# Patient Record
Sex: Female | Born: 2000 | Race: White | Hispanic: No | Marital: Single | Smoking: Former smoker
Health system: Southern US, Community
[De-identification: ages and names within clinical notes are randomized; demographics above are authoritative.]

## PROBLEM LIST (undated history)

## (undated) DIAGNOSIS — F32A Depression, unspecified: Secondary | ICD-10-CM

## (undated) DIAGNOSIS — F419 Anxiety disorder, unspecified: Secondary | ICD-10-CM

## (undated) HISTORY — DX: Anxiety disorder, unspecified: F41.9

## (undated) HISTORY — PX: TONSILLECTOMY AND ADENOIDECTOMY: SHX28

## (undated) HISTORY — DX: Depression, unspecified: F32.A

---
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
## 2020-01-04 ENCOUNTER — Ambulatory Visit (INDEPENDENT_AMBULATORY_CARE_PROVIDER_SITE_OTHER): Payer: BC Managed Care – PPO | Admitting: Hospice and Palliative Medicine

## 2020-01-04 ENCOUNTER — Encounter: Payer: Self-pay | Admitting: Hospice and Palliative Medicine

## 2020-01-04 ENCOUNTER — Encounter (INDEPENDENT_AMBULATORY_CARE_PROVIDER_SITE_OTHER): Payer: Self-pay

## 2020-01-04 ENCOUNTER — Other Ambulatory Visit: Payer: Self-pay

## 2020-01-04 DIAGNOSIS — E559 Vitamin D deficiency, unspecified: Secondary | ICD-10-CM

## 2020-01-04 DIAGNOSIS — F339 Major depressive disorder, recurrent, unspecified: Secondary | ICD-10-CM | POA: Diagnosis not present

## 2020-01-04 DIAGNOSIS — E538 Deficiency of other specified B group vitamins: Secondary | ICD-10-CM

## 2020-01-04 MED ORDER — CYANOCOBALAMIN 1000 MCG/ML IJ SOLN
1000.0000 ug | Freq: Once | INTRAMUSCULAR | Status: AC
  Administered 2020-01-04: 1000 ug via INTRAMUSCULAR

## 2020-01-04 MED ORDER — SERTRALINE HCL 50 MG PO TABS: 50.0000 mg | ORAL_TABLET | Freq: Every day | ORAL | 3 refills | Status: DC

## 2020-01-04 NOTE — Progress Notes (Signed)
[facility] [place], [place] [address]  Internal MEDICINE  Office Visit Note  Patient Name: Jordan Riley  [id number]  [id number]  Date of Service: 01/07/2020  Chief Complaint  Patient presents with  . Follow-up  . Depression  . Quality Metric Gaps    flu, covid, hep C    HPI Patient is here for routine follow-up She was recently started on Lexapro for her depression--she does not feel that her depression is well managed She feels sad most days, she is not sleeping well at night, averages about 3-4 hours of sleep per night She also says she feels that she has some form of eating disorder--maybe a binge eating disorder, she will eat some days until she feels sick and then she will go a day barely eating Since starting Lexapro she has not noticed any improvement in her symptoms or worsening of symptoms  She does not endorse any signs or report SI or HI   Current Medication: Outpatient Encounter Medications as of 01/04/2020  Medication Sig  . escitalopram (LEXAPRO) 10 MG tablet Take 1 tablet (10 mg total) by mouth daily.  . Norethin Ace-Eth Estrad-FE (LOESTRIN FE 1/20 PO) Take 1 tablet by mouth daily.  . sertraline (ZOLOFT) 50 MG tablet Take 1 tablet (50 mg total) by mouth daily.  . [EXPIRED] cyanocobalamin ((VITAMIN B-12)) injection 1,000 mcg    No facility-administered encounter medications on file as of 01/04/2020.    Surgical History: Past Surgical History:  Procedure Laterality Date  . TONSILLECTOMY AND ADENOIDECTOMY      Medical History: Past Medical History:  Diagnosis Date  . Depression     Family History: Family History  Problem Relation Age of Onset  . Heart disease Mother   . Hypothyroidism Mother     Social History   Socioeconomic History  . Marital status: Single    Spouse name: Not on file  . Number of children: Not on file  . Years of education: Not on file  . Highest education level: Not on file  Occupational  History  . Not on file  Tobacco Use  . Smoking status: Former Smoker    Types: E-cigarettes  . Smokeless tobacco: Never Used  Substance and Sexual Activity  . Alcohol use: Never  . Drug use: Never  . Sexual activity: Not on file  Other Topics Concern  . Not on file  Social History Narrative  . Not on file   Social Determinants of Health   Financial Resource Strain:   . Difficulty of Paying Living Expenses: Not on file  Food Insecurity:   . Worried About Charity fundraiser in the Last Year: Not on file  . Ran Out of Food in the Last Year: Not on file  Transportation Needs:   . Lack of Transportation (Medical): Not on file  . Lack of Transportation (Non-Medical): Not on file  Physical Activity:   . Days of Exercise per Week: Not on file  . Minutes of Exercise per Session: Not on file  Stress:   . Feeling of Stress : Not on file  Social Connections:   . Frequency of Communication with Friends and Family: Not on file  . Frequency of Social Gatherings with Friends and Family: Not on file  . Attends Religious Services: Not on file  . Active Member of Clubs or Organizations: Not on file  . Attends Archivist Meetings: Not on file  . Marital Status: Not on file  Intimate  Partner Violence:   . Fear of Current or Ex-Partner: Not on file  . Emotionally Abused: Not on file  . Physically Abused: Not on file  . Sexually Abused: Not on file      Review of Systems  Constitutional: Negative for chills, diaphoresis and fatigue.  HENT: Negative for ear pain, postnasal drip and sinus pressure.   Eyes: Negative for photophobia, discharge, redness, itching and visual disturbance.  Respiratory: Negative for cough, shortness of breath and wheezing.   Cardiovascular: Negative for chest pain, palpitations and leg swelling.  Gastrointestinal: Negative for abdominal pain, constipation, diarrhea, nausea and vomiting.  Genitourinary: Negative for dysuria and flank pain.   Musculoskeletal: Negative for arthralgias, back pain, gait problem and neck pain.  Skin: Negative for color change.  Allergic/Immunologic: Negative for environmental allergies and food allergies.  Neurological: Negative for dizziness and headaches.  Hematological: Does not bruise/bleed easily.  Psychiatric/Behavioral: Negative for agitation, behavioral problems (depression) and hallucinations.       Sad    Vital Signs: BP 115/66   Pulse 84   Temp (!) 97.5 F (36.4 C)   Resp 16   Ht 5\' 6"  (1.676 m)   Wt 198 lb (89.8 kg)   SpO2 99%   BMI 31.96 kg/m    Physical Exam Vitals reviewed.  Constitutional:      Appearance: Normal appearance.  Cardiovascular:     Rate and Rhythm: Normal rate and regular rhythm.     Pulses: Normal pulses.     Heart sounds: Normal heart sounds.  Pulmonary:     Effort: Pulmonary effort is normal.     Breath sounds: Normal breath sounds.  Musculoskeletal:        General: Normal range of motion.  Skin:    General: Skin is warm.  Neurological:     General: No focal deficit present.     Mental Status: She is alert and oriented to person, place, and time. Mental status is at baseline.  Psychiatric:        Mood and Affect: Mood normal.        Behavior: Behavior normal.        Thought Content: Thought content normal.    Assessment/Plan: 1. Vitamin B12 deficiency Injection administered today, will need month injections for replacement - cyanocobalamin ((VITAMIN B-12)) injection 1,000 mcg  2. Vitamin D deficiency Has started taking OTC vitamin D supplements--will continue monitoring  3. Depression, recurrent ([place]) Stop Lexapro and will start Zoloft Refer to psychiatry due to younger age as well as potential symptoms of binge eating Contact information for local therapists and counselors provided today--encouraged to seek counseling to help manage depression - Ambulatory referral to Psychiatry - sertraline (ZOLOFT) 50 MG tablet; Take 1 tablet (50  mg total) by mouth daily.  Dispense: 30 tablet; Refill: 3  General Counseling: Jordan Riley verbalizes understanding of the findings of todays visit and agrees with plan of treatment. I have discussed any further diagnostic evaluation that may be needed or ordered today. We also reviewed her medications today. she has been encouraged to call the office with any questions or concerns that should arise related to todays visit.    Orders Placed This Encounter  Procedures  . Ambulatory referral to Psychiatry    Meds ordered this encounter  Medications  . sertraline (ZOLOFT) 50 MG tablet    Sig: Take 1 tablet (50 mg total) by mouth daily.    Dispense:  30 tablet    Refill:  3  . cyanocobalamin ((  VITAMIN B-12)) injection 1,000 mcg    Time spent:30 Minutes Time spent includes review of chart, medications, test results and follow-up plan with the patient.  This patient was seen by Theodoro Grist AGNP-C in Collaboration with Dr Lavera Guise as a part of collaborative care agreement     Gale Klar. Kinjal Neitzke AGNP-C Internal medicine

## 2020-01-07 ENCOUNTER — Encounter: Payer: Self-pay | Admitting: Hospice and Palliative Medicine

## 2020-02-15 ENCOUNTER — Ambulatory Visit: Payer: BC Managed Care – PPO | Admitting: Hospice and Palliative Medicine

## 2020-02-22 ENCOUNTER — Ambulatory Visit: Payer: BC Managed Care – PPO | Admitting: Hospice and Palliative Medicine

## 2020-02-27 ENCOUNTER — Other Ambulatory Visit: Payer: Self-pay

## 2020-02-27 ENCOUNTER — Encounter: Payer: Self-pay | Admitting: Hospice and Palliative Medicine

## 2020-02-27 ENCOUNTER — Ambulatory Visit: Payer: BC Managed Care – PPO | Admitting: Hospice and Palliative Medicine

## 2020-02-27 VITALS — BP 106/68 | HR 96 | Temp 97.9°F | Resp 16 | Ht 66.0 in | Wt 211.0 lb

## 2020-02-27 DIAGNOSIS — E538 Deficiency of other specified B group vitamins: Secondary | ICD-10-CM

## 2020-02-27 DIAGNOSIS — E559 Vitamin D deficiency, unspecified: Secondary | ICD-10-CM

## 2020-02-27 DIAGNOSIS — R102 Pelvic and perineal pain: Secondary | ICD-10-CM

## 2020-02-27 DIAGNOSIS — R1030 Lower abdominal pain, unspecified: Secondary | ICD-10-CM

## 2020-02-27 DIAGNOSIS — F339 Major depressive disorder, recurrent, unspecified: Secondary | ICD-10-CM

## 2020-02-27 MED ORDER — SERTRALINE HCL 50 MG PO TABS: 50.0000 mg | ORAL_TABLET | Freq: Every day | ORAL | 1 refills | Status: DC

## 2020-02-27 NOTE — Progress Notes (Signed)
[facility] [place], [place] [address]  Internal MEDICINE  Office Visit Note  Patient Name: Jordan Riley  [id number]  [id number]  Date of Service: 02/29/2020  Chief Complaint  Patient presents with  . Follow-up    6 week medications    HPI Patient is here for routine follow-up At our last visit she was started on Zoloft--feels as though Zoloft is much more effective at controlling her anxiety She also reports she is sleeping better since starting Zoloft  Has not experienced negative side effects since starting medicine  Complaining today of lower abdominal pain associated with pelvic pressure, pain has been off and on for several months but over the last 2-3 weeks pain and pressure has gotten more severe, denies diarrhea, constipation, N/V At times pain becomes sharp and intense, at times pain causes nausea Denies vaginal discharge or odor  Has not yet started taking vitamin d supplements--will pick up OTC supplements this week   Current Medication: Outpatient Encounter Medications as of 02/27/2020  Medication Sig  . Norethin Ace-Eth Estrad-FE (LOESTRIN FE 1/20 PO) Take 1 tablet by mouth daily.  . [DISCONTINUED] escitalopram (LEXAPRO) 10 MG tablet Take 1 tablet (10 mg total) by mouth daily.  . [DISCONTINUED] sertraline (ZOLOFT) 50 MG tablet Take 1 tablet (50 mg total) by mouth daily.  . sertraline (ZOLOFT) 50 MG tablet Take 1 tablet (50 mg total) by mouth daily.   No facility-administered encounter medications on file as of 02/27/2020.    Surgical History: Past Surgical History:  Procedure Laterality Date  . TONSILLECTOMY AND ADENOIDECTOMY      Medical History: Past Medical History:  Diagnosis Date  . Depression     Family History: Family History  Problem Relation Age of Onset  . Heart disease Mother   . Hypothyroidism Mother     Social History   Socioeconomic History  . Marital status: Single    Spouse name: Not on file  .  Number of children: Not on file  . Years of education: Not on file  . Highest education level: Not on file  Occupational History  . Not on file  Tobacco Use  . Smoking status: Former Smoker    Types: E-cigarettes  . Smokeless tobacco: Never Used  Substance and Sexual Activity  . Alcohol use: Never  . Drug use: Never  . Sexual activity: Not on file  Other Topics Concern  . Not on file  Social History Narrative  . Not on file   Social Determinants of Health   Financial Resource Strain: Not on file  Food Insecurity: Not on file  Transportation Needs: Not on file  Physical Activity: Not on file  Stress: Not on file  Social Connections: Not on file  Intimate Partner Violence: Not on file    Review of Systems  Constitutional: Negative for chills, diaphoresis and fatigue.  HENT: Negative for ear pain, postnasal drip and sinus pressure.   Eyes: Negative for photophobia, discharge, redness, itching and visual disturbance.  Respiratory: Negative for cough, shortness of breath and wheezing.   Cardiovascular: Negative for chest pain, palpitations and leg swelling.  Gastrointestinal: Positive for abdominal distention and abdominal pain. Negative for constipation, diarrhea, nausea and vomiting.  Genitourinary: Negative for dysuria and flank pain.  Musculoskeletal: Negative for arthralgias, back pain, gait problem and neck pain.  Skin: Negative for color change.  Allergic/Immunologic: Negative for environmental allergies and food allergies.  Neurological: Negative for dizziness and headaches.  Hematological: Does not bruise/bleed easily.  Psychiatric/Behavioral: Negative for agitation, behavioral problems (depression) and hallucinations.    Vital Signs: BP 106/68   Pulse 96   Temp 97.9 F (36.6 C)   Resp 16   Ht 5\' 6"  (1.676 m)   Wt 211 lb (95.7 kg)   SpO2 98%   BMI 34.06 kg/m    Physical Exam Vitals reviewed.  Constitutional:      Appearance: Normal appearance. She is  obese.  Cardiovascular:     Rate and Rhythm: Normal rate and regular rhythm.     Pulses: Normal pulses.     Heart sounds: Normal heart sounds.  Pulmonary:     Effort: Pulmonary effort is normal.     Breath sounds: Normal breath sounds.  Abdominal:     General: There is distension.     Tenderness: There is abdominal tenderness in the right lower quadrant and left lower quadrant.  Musculoskeletal:     Cervical back: Normal range of motion.  Neurological:     Mental Status: She is alert.    Assessment/Plan: 1. Lower abdominal pain Will obtain CT abdomen due to lower quadrant pain and tenderness to palpation - CT Abdomen Pelvis Wo Contrast; Future  2. Pelvic pressure in female Symptom of pelvic pressure concerning--will review CT abdomen results - CT Abdomen Pelvis Wo Contrast; Future  3. Depression, recurrent ([place]) Feels depression controlled with Zoloft, will continue with therapy and continue to closely monitor - sertraline (ZOLOFT) 50 MG tablet; Take 1 tablet (50 mg total) by mouth daily.  Dispense: 90 tablet; Refill: 1  4. Vitamin B 12 deficiency Needs to set up schedule to continue with monthly injections  5. Vitamin D deficiency Advised to start OTC supplements to treat deficiency  General Counseling: Nicholl verbalizes understanding of the findings of todays visit and agrees with plan of treatment. I have discussed any further diagnostic evaluation that may be needed or ordered today. We also reviewed her medications today. she has been encouraged to call the office with any questions or concerns that should arise related to todays visit.    Orders Placed This Encounter  Procedures  . CT Abdomen Pelvis Wo Contrast    Meds ordered this encounter  Medications  . sertraline (ZOLOFT) 50 MG tablet    Sig: Take 1 tablet (50 mg total) by mouth daily.    Dispense:  90 tablet    Refill:  1    Time spent 30 Minutes Time spent includes review of chart, medications, test  results and follow-up plan with the patient.  This patient was seen by Theodoro Grist AGNP-C in Collaboration with Dr Lavera Guise as a part of collaborative care agreement     Ellene Bloodsaw. Milarose Savich AGNP-C Internal medicine

## 2020-02-29 ENCOUNTER — Encounter: Payer: Self-pay | Admitting: Hospice and Palliative Medicine

## 2020-03-20 DIAGNOSIS — D229 Melanocytic nevi, unspecified: Secondary | ICD-10-CM

## 2020-03-20 HISTORY — DX: Melanocytic nevi, unspecified: D22.9

## 2020-03-28 ENCOUNTER — Other Ambulatory Visit: Payer: Self-pay | Admitting: Hospice and Palliative Medicine

## 2020-03-28 ENCOUNTER — Other Ambulatory Visit: Payer: Self-pay

## 2020-03-28 ENCOUNTER — Telehealth: Payer: Self-pay

## 2020-03-28 DIAGNOSIS — R102 Pelvic and perineal pain: Secondary | ICD-10-CM

## 2020-03-28 DIAGNOSIS — R1030 Lower abdominal pain, unspecified: Secondary | ICD-10-CM

## 2020-03-28 NOTE — Progress Notes (Signed)
Changed ct orders to ultrasound per insurance requirement. Jordan Riley

## 2020-03-28 NOTE — Telephone Encounter (Signed)
Left message and advised pt of ultrasound appt and asked her to call back to schd a follow up for result. Jordan Riley

## 2020-04-04 ENCOUNTER — Ambulatory Visit: Admission: RE | Admit: 2020-04-04 | Payer: BC Managed Care – PPO | Source: Ambulatory Visit

## 2020-04-04 ENCOUNTER — Telehealth: Payer: Self-pay

## 2020-04-04 NOTE — Telephone Encounter (Signed)
Per [facility] pt did not show up for ultrasound, she needs to call back and reschedule, this was needed before ct could be approved. [phone]. Jordan Riley

## 2020-06-27 ENCOUNTER — Other Ambulatory Visit: Payer: Self-pay

## 2020-06-27 ENCOUNTER — Ambulatory Visit (INDEPENDENT_AMBULATORY_CARE_PROVIDER_SITE_OTHER): Payer: BC Managed Care – PPO | Admitting: Hospice and Palliative Medicine

## 2020-06-27 ENCOUNTER — Encounter: Payer: Self-pay | Admitting: Hospice and Palliative Medicine

## 2020-06-27 VITALS — BP 104/56 | HR 83 | Temp 97.1°F | Resp 16 | Ht 66.0 in | Wt 215.6 lb

## 2020-06-27 DIAGNOSIS — R102 Pelvic and perineal pain: Secondary | ICD-10-CM | POA: Diagnosis not present

## 2020-06-27 DIAGNOSIS — F418 Other specified anxiety disorders: Secondary | ICD-10-CM

## 2020-06-27 DIAGNOSIS — L732 Hidradenitis suppurativa: Secondary | ICD-10-CM | POA: Diagnosis not present

## 2020-06-27 DIAGNOSIS — R140 Abdominal distension (gaseous): Secondary | ICD-10-CM

## 2020-06-27 DIAGNOSIS — Z0001 Encounter for general adult medical examination with abnormal findings: Secondary | ICD-10-CM | POA: Diagnosis not present

## 2020-06-27 DIAGNOSIS — Z124 Encounter for screening for malignant neoplasm of cervix: Secondary | ICD-10-CM | POA: Diagnosis not present

## 2020-06-27 DIAGNOSIS — R5383 Other fatigue: Secondary | ICD-10-CM

## 2020-06-27 DIAGNOSIS — R300 Dysuria: Secondary | ICD-10-CM

## 2020-06-27 MED ORDER — CLINDAMYCIN PHOSPHATE 1 % EX GEL: Freq: Two times a day (BID) | CUTANEOUS | 1 refills | Status: DC

## 2020-06-27 MED ORDER — HYDROXYZINE PAMOATE 25 MG PO CAPS
25.0000 mg | ORAL_CAPSULE | Freq: Three times a day (TID) | ORAL | 0 refills | Status: DC | PRN
Start: 2020-06-27 — End: 2021-02-04

## 2020-06-27 NOTE — Progress Notes (Signed)
[facility] [place], [place] [address]  Internal MEDICINE  Office Visit Note  Patient Name: Jordan Riley  [id number]  [id number]  Date of Service: 06/30/2020  Chief Complaint  Patient presents with  . Annual Exam    Discuss digestive system, when pt eats stomach hurts, bloated, diarrhea, discuss meds,   . Depression     HPI Pt is here for routine health maintenance examination Dr. Si Raider GYN manages birth control Continuing to have abdominal bloating, excessive gad, pain and diarrhea with eating--did not go for testing ordered at previous visit Started on Zoloft at our last visit for uncontrolled anxiety, unable to tolerate as she did not like the way the medication feel on a daily basis Feels that her day to day anxiety has improved but struggles more with situational anxiety, would prefer to have a medication she can take as needed for situational anxiety instead of taking a daily medication  Having a break out of hidradenitis suppurativa in her groin, has been seen by dermatology in the past, was given prescription gel that did help with breakouts in the past  Current Medication: Outpatient Encounter Medications as of 06/27/2020  Medication Sig  . clindamycin (CLINDAGEL) 1 % gel Apply topically 2 (two) times daily.  . hydrOXYzine (VISTARIL) 25 MG capsule Take 1 capsule (25 mg total) by mouth every 8 (eight) hours as needed.  . Norethin Ace-Eth Estrad-FE (LOESTRIN FE 1/20 PO) Take 1 tablet by mouth daily.  . [DISCONTINUED] sertraline (ZOLOFT) 50 MG tablet Take 1 tablet (50 mg total) by mouth daily. (Patient not taking: Reported on 06/27/2020)   No facility-administered encounter medications on file as of 06/27/2020.    Surgical History: Past Surgical History:  Procedure Laterality Date  . TONSILLECTOMY AND ADENOIDECTOMY      Medical History: Past Medical History:  Diagnosis Date  . Depression     Family History: Family History  Problem  Relation Age of Onset  . Heart disease Mother   . Hypothyroidism Mother       Review of Systems  Constitutional: Negative for chills, diaphoresis and fatigue.  HENT: Negative for ear pain, postnasal drip and sinus pressure.   Eyes: Negative for photophobia, discharge, redness, itching and visual disturbance.  Respiratory: Negative for cough, shortness of breath and wheezing.   Cardiovascular: Negative for chest pain, palpitations and leg swelling.  Gastrointestinal: Negative for abdominal pain, constipation, diarrhea, nausea and vomiting.  Genitourinary: Negative for dysuria and flank pain.  Musculoskeletal: Negative for arthralgias, back pain, gait problem and neck pain.  Skin: Negative for color change.       Groin cysts, history of HS  Allergic/Immunologic: Negative for environmental allergies and food allergies.  Neurological: Negative for dizziness and headaches.  Hematological: Does not bruise/bleed easily.  Psychiatric/Behavioral: Negative for agitation, behavioral problems (depression) and hallucinations. The patient is nervous/anxious.      Vital Signs: BP (!) 104/56   Pulse 83   Temp (!) 97.1 F (36.2 C)   Resp 16   Ht 5\' 6"  (1.676 m)   Wt 215 lb 9.6 oz (97.8 kg)   SpO2 99%   BMI 34.80 kg/m    Physical Exam Vitals reviewed.  Constitutional:      Appearance: Normal appearance. She is normal weight.  Cardiovascular:     Rate and Rhythm: Normal rate and regular rhythm.     Pulses: Normal pulses.     Heart sounds: Normal heart sounds.  Pulmonary:     Effort:  Pulmonary effort is normal.     Breath sounds: Normal breath sounds.  Chest:  Breasts:     Right: Normal.     Left: Normal.    Abdominal:     General: Abdomen is flat.     Palpations: Abdomen is soft.  Musculoskeletal:        General: Normal range of motion.     Cervical back: Normal range of motion.  Skin:    General: Skin is warm.  Neurological:     General: No focal deficit present.      Mental Status: She is alert and oriented to person, place, and time. Mental status is at baseline.  Psychiatric:        Mood and Affect: Mood normal.        Behavior: Behavior normal.        Thought Content: Thought content normal.        Judgment: Judgment normal.      LABS: Recent Results (from the past 2160 hour(s))  UA/M w/rflx Culture, Routine     Status: Abnormal   Collection Time: 06/27/20  9:30 AM   Specimen: Urine   Urine  Result Value Ref Range   Specific Gravity, UA      >=1.030 (A) 1.005 - 1.030   pH, UA 5.5 5.0 - 7.5   Color, UA Yellow Yellow   Appearance Ur Turbid (A) Clear   Leukocytes,UA 1+ (A) Negative   Protein,UA Trace Negative/Trace   Glucose, UA Negative Negative   Ketones, UA Negative Negative   RBC, UA Negative Negative   Bilirubin, UA Negative Negative   Urobilinogen, Ur 0.2 0.2 - 1.0 mg/dL   Nitrite, UA Negative Negative   Microscopic Examination See below:     Comment: Microscopic was indicated and was performed.   Urinalysis Reflex Comment     Comment: This specimen has reflexed to a Urine Culture.  Microscopic Examination     Status: Abnormal   Collection Time: 06/27/20  9:30 AM   Urine  Result Value Ref Range   WBC, UA 6-10 (A) 0 - 5 /hpf   RBC 3-10 (A) 0 - 2 /hpf   Epithelial Cells (non renal) >10 (A) 0 - 10 /hpf   Casts Present (A) None seen /lpf   Cast Type Epithelial cell casts (A) N/A   Bacteria, UA Many (A) None seen/Few  Urine Culture, Reflex     Status: None   Collection Time: 06/27/20  9:30 AM   Urine  Result Value Ref Range   Urine Culture, Routine Final report    Organism ID, Bacteria Comment     Comment: Mixed urogenital flora 25,000-50,000 colony forming units per mL      Assessment/Plan: 1. Encounter for routine adult health examination with abnormal findings Well appearing 20 year old female Up to date on age appropriate PHM Review routine labs and adjust plan of care as indicated  2. Abdominal distension  (gaseous) Review results of CT scan and adjust therapy as indicated - CT Abdomen Pelvis Wo Contrast; Future  3. Pelvic pressure in female Will review CT abdomen and adjust plan of care as indicated  4. Situational anxiety May try Vistaril as needed for situational anxiety, will monitor and adjust therapy as indicated - hydrOXYzine (VISTARIL) 25 MG capsule; Take 1 capsule (25 mg total) by mouth every 8 (eight) hours as needed.  Dispense: 30 capsule; Refill: 0  5. Hidradenitis suppurativa May apply Clindagel to affected areas, may consider prophylaxis therapy if  breakouts continue - clindamycin (CLINDAGEL) 1 % gel; Apply topically 2 (two) times daily.  Dispense: 60 g; Refill: 1  6. Other fatigue - CBC w/Diff/Platelet - Comprehensive Metabolic Panel (CMET) - Vitamin D (25 hydroxy) - Lipid Panel With LDL/HDL Ratio - B12 - TSH + free T4  7. Dysuria - UA/M w/rflx Culture, Routine - Microscopic Examination - Urine Culture, Reflex  General Counseling: Suhaylah verbalizes understanding of the findings of todays visit and agrees with plan of treatment. I have discussed any further diagnostic evaluation that may be needed or ordered today. We also reviewed her medications today. she has been encouraged to call the office with any questions or concerns that should arise related to todays visit.    Counseling:    Orders Placed This Encounter  Procedures  . Microscopic Examination  . Urine Culture, Reflex  . CT Abdomen Pelvis Wo Contrast  . CBC w/Diff/Platelet  . Comprehensive Metabolic Panel (CMET)  . Vitamin D (25 hydroxy)  . Lipid Panel With LDL/HDL Ratio  . B12  . TSH + free T4  . UA/M w/rflx Culture, Routine    Meds ordered this encounter  Medications  . hydrOXYzine (VISTARIL) 25 MG capsule    Sig: Take 1 capsule (25 mg total) by mouth every 8 (eight) hours as needed.    Dispense:  30 capsule    Refill:  0  . clindamycin (CLINDAGEL) 1 % gel    Sig: Apply topically 2  (two) times daily.    Dispense:  60 g    Refill:  1    Total time spent: 30 Minutes  Time spent includes review of chart, medications, test results, and follow up plan with the patient.   This patient was seen by Theodoro Grist AGNP-C Collaboration with Dr Lavera Guise as a part of collaborative care agreement   Anahlia Iseminger. [facility] Internal Medicine

## 2020-06-30 ENCOUNTER — Encounter: Payer: Self-pay | Admitting: Hospice and Palliative Medicine

## 2020-06-30 LAB — UA/M W/RFLX CULTURE, ROUTINE
Bilirubin, UA: NEGATIVE
Glucose, UA: NEGATIVE
Ketones, UA: NEGATIVE
Nitrite, UA: NEGATIVE
RBC, UA: NEGATIVE
Specific Gravity, UA: >=1.03 — AB (ref 1.005–1.030)
Urobilinogen, Ur: 0.2 mg/dL (ref 0.2–1.0)
pH, UA: 5.5 (ref 5.0–7.5)

## 2020-06-30 LAB — URINE CULTURE, REFLEX

## 2020-06-30 LAB — MICROSCOPIC EXAMINATION: Epithelial Cells (non renal): >10 /hpf — AB (ref 0–10)

## 2020-08-27 ENCOUNTER — Other Ambulatory Visit: Payer: Self-pay

## 2020-08-27 ENCOUNTER — Encounter: Payer: Self-pay | Admitting: Physician Assistant

## 2020-08-27 ENCOUNTER — Ambulatory Visit: Payer: BC Managed Care – PPO | Admitting: Physician Assistant

## 2020-08-27 DIAGNOSIS — F339 Major depressive disorder, recurrent, unspecified: Secondary | ICD-10-CM | POA: Diagnosis not present

## 2020-08-27 DIAGNOSIS — R140 Abdominal distension (gaseous): Secondary | ICD-10-CM | POA: Diagnosis not present

## 2020-08-27 DIAGNOSIS — F418 Other specified anxiety disorders: Secondary | ICD-10-CM

## 2020-08-27 MED ORDER — ESCITALOPRAM OXALATE 5 MG PO TABS: 5.0000 mg | ORAL_TABLET | Freq: Every day | ORAL | 2 refills | Status: DC

## 2020-08-27 NOTE — Progress Notes (Signed)
[facility] [place], [place] [address]  Internal MEDICINE  Office Visit Note  Patient Name: Jordan Riley  [id number]  [id number]  Date of Service: 08/28/2020  Chief Complaint  Patient presents with   Follow-up    CT    HPI Patient is here for routine follow-up -Stressed and anxious with work and school and the world state right now. A lot of change the past few months. -abdominal pain and chest pain at times when feeling very anxious. Feels like her heart is racing. Denies any of these symptoms currently.  Reports hydroxyzine helps when she is able to take it, however there have been a few instances where she has been so overwhelmed that she has not been able to take it in time.  Did not tolerate Zoloft, but is willing to try a new daily medication.   -Does not sleep well. Hard time going back to sleep if she wakes up early in the morning.  Reports her boyfriend leaves between 3 and 4 AM and this wakes her and she is unable to go back to sleep.  Discussed trying earplugs as well as active thinking exercises. Goes to work at 76, works from home. Goes to bed at 10:45-11 and falls asleep in 20-30 minutes. -Changing diet and walking during lunch breaks and increasing water has helped, and GI upset has improved some with these changes. Food not going straight through her anymore. -Depression improved, mainly anxiety. Denies SI/HI -Did not get her labs done and will now.  Reports abdominal CT was denied.  Current Medication: Outpatient Encounter Medications as of 08/27/2020  Medication Sig   clindamycin (CLINDAGEL) 1 % gel Apply topically 2 (two) times daily.   escitalopram (LEXAPRO) 5 MG tablet Take 1 tablet (5 mg total) by mouth daily.   hydrOXYzine (VISTARIL) 25 MG capsule Take 1 capsule (25 mg total) by mouth every 8 (eight) hours as needed.   Norethin Ace-Eth Estrad-FE (LOESTRIN FE 1/20 PO) Take 1 tablet by mouth daily.   No facility-administered encounter  medications on file as of 08/27/2020.    Surgical History: Past Surgical History:  Procedure Laterality Date   TONSILLECTOMY AND ADENOIDECTOMY      Medical History: Past Medical History:  Diagnosis Date   Depression     Family History: Family History  Problem Relation Age of Onset   Heart disease Mother    Hypothyroidism Mother     Social History   Socioeconomic History   Marital status: Single    Spouse name: Not on file   Number of children: Not on file   Years of education: Not on file   Highest education level: Not on file  Occupational History   Not on file  Tobacco Use   Smoking status: Former    Pack years: 0.00    Types: E-cigarettes   Smokeless tobacco: Never  Substance and Sexual Activity   Alcohol use: Never   Drug use: Never   Sexual activity: Not on file  Other Topics Concern   Not on file  Social History Narrative   Not on file   Social Determinants of Health   Financial Resource Strain: Not on file  Food Insecurity: Not on file  Transportation Needs: Not on file  Physical Activity: Not on file  Stress: Not on file  Social Connections: Not on file  Intimate Partner Violence: Not on file      Review of Systems  Constitutional:  Negative for chills, fatigue and  unexpected weight change.  HENT:  Negative for congestion, postnasal drip, rhinorrhea, sneezing and sore throat.   Eyes:  Negative for redness.  Respiratory:  Negative for cough, chest tightness and shortness of breath.   Cardiovascular:  Positive for chest pain and palpitations.       CP and palp only during period of acute anxiety  Gastrointestinal:  Positive for abdominal pain and diarrhea. Negative for constipation, nausea and vomiting.  Genitourinary:  Negative for dysuria and frequency.  Musculoskeletal:  Negative for arthralgias, back pain, joint swelling and neck pain.  Skin:  Negative for rash.  Neurological: Negative.  Negative for tremors and numbness.  Hematological:   Negative for adenopathy. Does not bruise/bleed easily.  Psychiatric/Behavioral:  Positive for sleep disturbance. Negative for behavioral problems (Depression) and suicidal ideas. The patient is nervous/anxious.    Vital Signs: BP 122/66   Pulse 74   Temp 97.8 F (36.6 C)   Resp 16   Ht 5\' 6"  (1.676 m)   Wt 213 lb (96.6 kg)   SpO2 99%   BMI 34.38 kg/m    Physical Exam Vitals and nursing note reviewed.  Constitutional:      General: She is not in acute distress.    Appearance: She is well-developed. She is obese. She is not diaphoretic.  HENT:     Head: Normocephalic and atraumatic.     Mouth/Throat:     Pharynx: No oropharyngeal exudate.  Eyes:     Pupils: Pupils are equal, round, and reactive to light.  Neck:     Thyroid: No thyromegaly.     Vascular: No JVD.     Trachea: No tracheal deviation.  Cardiovascular:     Rate and Rhythm: Normal rate and regular rhythm.     Heart sounds: Normal heart sounds. No murmur heard.   No friction rub. No gallop.  Pulmonary:     Effort: Pulmonary effort is normal. No respiratory distress.     Breath sounds: No wheezing or rales.  Chest:     Chest wall: No tenderness.  Abdominal:     General: Bowel sounds are normal.     Palpations: Abdomen is soft.     Tenderness: There is no abdominal tenderness.  Musculoskeletal:        General: Normal range of motion.     Cervical back: Normal range of motion and neck supple.  Lymphadenopathy:     Cervical: No cervical adenopathy.  Skin:    General: Skin is warm and dry.  Neurological:     Mental Status: She is alert and oriented to person, place, and time.     Cranial Nerves: No cranial nerve deficit.  Psychiatric:        Behavior: Behavior normal.        Thought Content: Thought content normal.        Judgment: Judgment normal.       Assessment/Plan: 1. Situational anxiety Patient did not tolerate Zoloft, will try Lexapro starting at a low dose of 5 mg--may need to titrate up.  Pt would also like referral to psych for counseling. Pt will also have labs done. Discussed active thinking and deep breathing exercises. - escitalopram (LEXAPRO) 5 MG tablet; Take 1 tablet (5 mg total) by mouth daily.  Dispense: 30 tablet; Refill: 2 - Ambulatory referral to Psychiatry  2. Depression, recurrent ([place]) Stable, continue to monitor  3. Abdominal distension (gaseous) Improving with diet and activity changes.    General Counseling: Birdie Sons understanding of  the findings of todays visit and agrees with plan of treatment. I have discussed any further diagnostic evaluation that may be needed or ordered today. We also reviewed her medications today. she has been encouraged to call the office with any questions or concerns that should arise related to todays visit.    Orders Placed This Encounter  Procedures   Ambulatory referral to Psychiatry     Meds ordered this encounter  Medications   escitalopram (LEXAPRO) 5 MG tablet    Sig: Take 1 tablet (5 mg total) by mouth daily.    Dispense:  30 tablet    Refill:  2     This patient was seen by Drema Dallas, PA-C in collaboration with Dr. Clayborn Bigness as a part of collaborative care agreement.   Total time spent:30 Minutes Time spent includes review of chart, medications, test results, and follow up plan with the patient.      Dr Lavera Guise Internal medicine

## 2020-08-29 ENCOUNTER — Emergency Department
Admission: EM | Admit: 2020-08-29 | Discharge: 2020-08-29 | Disposition: A | Discharge status: Home / Self Care | Payer: BC Managed Care – PPO | Attending: Emergency Medicine | Admitting: Emergency Medicine

## 2020-08-29 ENCOUNTER — Other Ambulatory Visit: Payer: Self-pay

## 2020-08-29 DIAGNOSIS — R059 Cough, unspecified: Secondary | ICD-10-CM | POA: Insufficient documentation

## 2020-08-29 DIAGNOSIS — B9689 Other specified bacterial agents as the cause of diseases classified elsewhere: Secondary | ICD-10-CM | POA: Insufficient documentation

## 2020-08-29 DIAGNOSIS — N390 Urinary tract infection, site not specified: Secondary | ICD-10-CM | POA: Diagnosis not present

## 2020-08-29 DIAGNOSIS — Z87891 Personal history of nicotine dependence: Secondary | ICD-10-CM | POA: Diagnosis not present

## 2020-08-29 DIAGNOSIS — J029 Acute pharyngitis, unspecified: Secondary | ICD-10-CM | POA: Insufficient documentation

## 2020-08-29 DIAGNOSIS — R5383 Other fatigue: Secondary | ICD-10-CM | POA: Diagnosis present

## 2020-08-29 LAB — CBC WITH DIFFERENTIAL/PLATELET
Abs Immature Granulocytes: 0.07 10*3/uL (ref 0.00–0.07)
Basophils Absolute: 0 10*3/uL (ref 0.0–0.1)
Basophils Relative: 0 %
Eosinophils Absolute: 0.1 10*3/uL (ref 0.0–0.5)
Eosinophils Relative: 1 %
HCT: 37.6 % (ref 36.0–46.0)
Hemoglobin: 13.2 g/dL (ref 12.0–15.0)
Immature Granulocytes: 1 %
Lymphocytes Relative: 12 %
Lymphs Abs: 1.4 10*3/uL (ref 0.7–4.0)
MCH: 29.4 pg (ref 26.0–34.0)
MCHC: 35.1 g/dL (ref 30.0–36.0)
MCV: 83.7 fL (ref 80.0–100.0)
Monocytes Absolute: 0.7 10*3/uL (ref 0.1–1.0)
Monocytes Relative: 6 %
Neutro Abs: 9.5 10*3/uL — ABNORMAL HIGH (ref 1.7–7.7)
Neutrophils Relative %: 80 %
Platelets: 279 10*3/uL (ref 150–400)
RBC: 4.49 MIL/uL (ref 3.87–5.11)
RDW: 11.8 % (ref 11.5–15.5)
WBC: 11.8 10*3/uL — ABNORMAL HIGH (ref 4.0–10.5)
nRBC: 0 % (ref 0.0–0.2)

## 2020-08-29 LAB — COMPREHENSIVE METABOLIC PANEL
ALT: 17 U/L (ref 0–44)
AST: 23 U/L (ref 15–41)
Albumin: 4.3 g/dL (ref 3.5–5.0)
Alkaline Phosphatase: 54 U/L (ref 38–126)
Anion gap: 10 (ref 5–15)
BUN: 11 mg/dL (ref 6–20)
CO2: 24 mmol/L (ref 22–32)
Calcium: 9.6 mg/dL (ref 8.9–10.3)
Chloride: 103 mmol/L (ref 98–111)
Creatinine, Ser: 0.87 mg/dL (ref 0.44–1.00)
GFR, Estimated: >60 mL/min (ref >60)
Glucose, Bld: 123 mg/dL — ABNORMAL HIGH (ref 70–99)
Potassium: 3.6 mmol/L (ref 3.5–5.1)
Sodium: 137 mmol/L (ref 135–145)
Total Bilirubin: 0.8 mg/dL (ref 0.3–1.2)
Total Protein: 8.1 g/dL (ref 6.5–8.1)

## 2020-08-29 LAB — URINALYSIS, COMPLETE (UACMP) WITH MICROSCOPIC
Bilirubin Urine: NEGATIVE
Glucose, UA: NEGATIVE mg/dL
Hgb urine dipstick: NEGATIVE
Ketones, ur: 5 mg/dL — AB
Leukocytes,Ua: NEGATIVE
Nitrite: NEGATIVE
Protein, ur: 100 mg/dL — AB
Specific Gravity, Urine: 1.032 — ABNORMAL HIGH (ref 1.005–1.030)
pH: 5 (ref 5.0–8.0)

## 2020-08-29 LAB — POC URINE PREG, ED: Preg Test, Ur: NEGATIVE

## 2020-08-29 LAB — LIPASE, BLOOD: Lipase: 25 U/L (ref 11–51)

## 2020-08-29 MED ORDER — ONDANSETRON 4 MG PO TBDP
4.0000 mg | ORAL_TABLET | Freq: Once | ORAL | Status: AC
  Administered 2020-08-29: 4 mg via ORAL

## 2020-08-29 MED ORDER — ONDANSETRON HCL 8 MG PO TABS: 8.0000 mg | ORAL_TABLET | Freq: Three times a day (TID) | ORAL | 0 refills | Status: DC | PRN

## 2020-08-29 NOTE — ED Triage Notes (Addendum)
Pt states she was seen at [facility] yesterday for cough sore throat and fatigue, covid strep and flu all came back negative. Pt was dx with URI and given abx. Pt states when she got home and took abx she vomittied and has been vomiting multiple times since.

## 2020-08-29 NOTE — ED Notes (Signed)
See triage note  States she was seen for URI sx's placed on doxy  States she thinks that is what is making her vomit  States she was nauseated prior to starting new meds  Afebrile on arrival   Has been able to keep fluids down since Zofran was given

## 2020-08-29 NOTE — ED Notes (Signed)
urine preg negative

## 2020-08-29 NOTE — Discharge Instructions (Addendum)
Continue previous antibiotic as directed.  Take Zofran as needed for nausea and vomiting.

## 2020-08-29 NOTE — ED Provider Notes (Signed)
[facility] Emergency Department Provider Note   ____________________________________________   Event Date/Time   First MD Initiated Contact with Patient 08/29/20 0809     (approximate)  I have reviewed the triage vital signs and the nursing notes.   HISTORY  Chief Complaint Emesis    HPI Jordan Riley is a 20 y.o. female patient was seen at urgent care clinic yesterday for sore throat, fatigue, and cough.  Patient COVID-19 and flu results were negative.  Patient was diagnosed with upper respiratory infection and given doxycycline.  Patient if she got home took the antibiotic she became nauseated started vomiting.         Past Medical History:  Diagnosis Date   Depression     There are no problems to display for this patient.   Past Surgical History:  Procedure Laterality Date   TONSILLECTOMY AND ADENOIDECTOMY      Prior to Admission medications   Medication Sig Start Date End Date Taking? Authorizing Provider  doxycycline (ADOXA) 100 MG tablet Take 100 mg by mouth 2 (two) times daily.   Yes [provider]  ondansetron (ZOFRAN) 8 MG tablet Take 1 tablet (8 mg total) by mouth every 8 (eight) hours as needed for nausea or vomiting. 08/29/20  Yes Sable Feil, PA-C  predniSONE (STERAPRED UNI-PAK 21 TAB) 5 MG (21) TBPK tablet Take 5 mg by mouth daily.   Yes [provider]  clindamycin (CLINDAGEL) 1 % gel Apply topically 2 (two) times daily. 06/27/20   Luiz Ochoa, NP  escitalopram (LEXAPRO) 5 MG tablet Take 1 tablet (5 mg total) by mouth daily. 08/27/20   McDonough, Si Gaul, PA-C  hydrOXYzine (VISTARIL) 25 MG capsule Take 1 capsule (25 mg total) by mouth every 8 (eight) hours as needed. 06/27/20   Luiz Ochoa, NP  Norethin Ace-Eth Estrad-FE (LOESTRIN FE 1/20 PO) Take 1 tablet by mouth daily.    [provider]    Allergies Omnicef [cefdinir]  Family History  Problem Relation Age of Onset   Heart  disease Mother    Hypothyroidism Mother     Social History Social History   Tobacco Use   Smoking status: Former    Pack years: 0.00    Types: E-cigarettes   Smokeless tobacco: Never  Substance Use Topics   Alcohol use: Never   Drug use: Never    Review of Systems  Constitutional: No fever/chills Eyes: No visual changes. ENT: No sore throat. Cardiovascular: Denies chest pain. Respiratory: Denies shortness of breath. Gastrointestinal: No abdominal pain.  Nausea and vomiting.   No diarrhea.  No constipation. Genitourinary: Negative for dysuria. Musculoskeletal: Negative for back pain. Skin: Negative for rash. Neurological: Negative for headaches, focal weakness or numbness. Psychiatric: Depression Allergic/Immunilogical: Omnicef ____________________________________________   PHYSICAL EXAM:  VITAL SIGNS: ED Triage Vitals  Enc Vitals Group     BP 08/29/20 0346 126/81     Pulse Rate 08/29/20 0346 84     Resp 08/29/20 0346 18     Temp 08/29/20 0346 98.5 F (36.9 C)     Temp Source 08/29/20 0346 Oral     SpO2 08/29/20 0346 97 %     Weight 08/29/20 0344 210 lb (95.3 kg)     Height 08/29/20 0344 5\' 6"  (1.676 m)     Head Circumference --      Peak Flow --      Pain Score 08/29/20 0344 0     Pain Loc --  Pain Edu? --      Excl. in [place]? --     Constitutional: Alert and oriented. Well appearing and in no acute distress. Eyes: Conjunctivae are normal. PERRL. EOMI. Head: Atraumatic. Nose: No congestion/rhinnorhea. Mouth/Throat: Mucous membranes are moist.  Oropharynx non-erythematous. Hematological/Lymphatic/Immunilogical: No cervical lymphadenopathy. Cardiovascular: Normal rate, regular rhythm. Grossly normal heart sounds.  Good peripheral circulation. Respiratory: Normal respiratory effort.  No retractions. Lungs CTAB. Gastrointestinal: Soft and nontender. No distention. No abdominal bruits. No CVA tenderness. Genitourinary: Deferred Psychiatric: Mood and  affect are normal. Speech and behavior are normal.  ____________________________________________   LABS (all labs ordered are listed, but only abnormal results are displayed)  Labs Reviewed  URINALYSIS, COMPLETE (UACMP) WITH MICROSCOPIC - Abnormal; Notable for the following components:      Result Value   Color, Urine AMBER (*)    APPearance CLOUDY (*)    Specific Gravity, Urine 1.032 (*)    Ketones, ur 5 (*)    Protein, ur 100 (*)    Bacteria, UA MANY (*)    All other components within normal limits  CBC WITH DIFFERENTIAL/PLATELET - Abnormal; Notable for the following components:   WBC 11.8 (*)    Neutro Abs 9.5 (*)    All other components within normal limits  COMPREHENSIVE METABOLIC PANEL - Abnormal; Notable for the following components:   Glucose, Bld 123 (*)    All other components within normal limits  LIPASE, BLOOD   ____________________________________________  EKG   ____________________________________________  RADIOLOGY I, Sable Feil, personally viewed and evaluated these images (plain radiographs) as part of my medical decision making, as well as reviewing the written report by the radiologist.  ED MD interpretation:    Official radiology report(s): No results found.  ____________________________________________   PROCEDURES  Procedure(s) performed (including Critical Care):  Procedures   ____________________________________________   INITIAL IMPRESSION / ASSESSMENT AND PLAN / ED COURSE  As part of my medical decision making, I reviewed the following data within the [place]         Patient presents with normal vomiting status post starting taking doxycycline for respiratory infection.  Discussed patient labs today which are consistent with urinary tract infection.  Patient advised continue antibiotics and  given Zofran to help with the nausea and vomiting.  Follow-up with PCP.      ____________________________________________   FINAL CLINICAL IMPRESSION(S) / ED DIAGNOSES  Final diagnoses:  Lower urinary tract infectious disease     ED Discharge Orders          Ordered    ondansetron (ZOFRAN) 8 MG tablet  Every 8 hours PRN        08/29/20 0846             Note:  This document was prepared using Dragon voice recognition software and may include unintentional dictation errors.    Sable Feil, PA-C 08/29/20 4259    Carrie Mew, MD 08/30/20 2325

## 2020-08-31 ENCOUNTER — Other Ambulatory Visit: Payer: Self-pay

## 2020-10-01 ENCOUNTER — Ambulatory Visit: Payer: BC Managed Care – PPO | Admitting: Physician Assistant

## 2020-11-05 ENCOUNTER — Encounter: Payer: Self-pay | Admitting: Physician Assistant

## 2020-11-05 ENCOUNTER — Other Ambulatory Visit: Payer: Self-pay

## 2020-11-05 ENCOUNTER — Ambulatory Visit: Payer: BC Managed Care – PPO | Admitting: Physician Assistant

## 2020-11-05 DIAGNOSIS — F411 Generalized anxiety disorder: Secondary | ICD-10-CM

## 2020-11-05 DIAGNOSIS — F339 Major depressive disorder, recurrent, unspecified: Secondary | ICD-10-CM | POA: Diagnosis not present

## 2020-11-05 NOTE — Progress Notes (Signed)
[facility] [place], [place] [address]  Internal MEDICINE  Office Visit Note  Patient Name: Jordan Riley  [id number]  [id number]  Date of Service: 11/05/2020  Chief Complaint  Patient presents with   Follow-up   Depression    HPI Pt is here for routine follow up -Feeling better physically and has ben exercising -Mental health has worsened. Lexapro helped when she took it but forgets about it, States she has the same problem with hygeine things like brushing teeth -A lot going on right now. -Lots of Anxiety--thinks of the worse case scenario. Becomes overstimulated when it reaches a threshold. Can get angry after a certain point and wont take medicine then and feels out of control. Walks away from arguments or says things that will get a reaction. States she is aware that its not right but cant stop from doing it. Then the aftermath makes her feels depressed because she feels guilty for her actions. -Future plans changing unsettles her, thinking about future events is a trigger -Self sabbotages, knows she should take meds but then gets distracted and doesn't take it, but then feels guitly -Stressed from school. Had a month off July-aug was able to work out more, but then wasn't getting enough sleep. Feels like she fixes one thing and triggers another.  -trouble focusing and concentrating which then triggers further anxiety.  -Has been taking hydroxyzine and it helps for acute anxiety  Current Medication: Outpatient Encounter Medications as of 11/05/2020  Medication Sig   clindamycin (CLINDAGEL) 1 % gel Apply topically 2 (two) times daily.   escitalopram (LEXAPRO) 5 MG tablet Take 1 tablet (5 mg total) by mouth daily.   hydrOXYzine (VISTARIL) 25 MG capsule Take 1 capsule (25 mg total) by mouth every 8 (eight) hours as needed.   Norethin Ace-Eth Estrad-FE (LOESTRIN FE 1/20 PO) Take 1 tablet by mouth daily.   [DISCONTINUED] doxycycline (ADOXA) 100 MG tablet  Take 100 mg by mouth 2 (two) times daily.   [DISCONTINUED] ondansetron (ZOFRAN) 8 MG tablet Take 1 tablet (8 mg total) by mouth every 8 (eight) hours as needed for nausea or vomiting.   [DISCONTINUED] predniSONE (STERAPRED UNI-PAK 21 TAB) 5 MG (21) TBPK tablet Take 5 mg by mouth daily.   No facility-administered encounter medications on file as of 11/05/2020.    Surgical History: Past Surgical History:  Procedure Laterality Date   TONSILLECTOMY AND ADENOIDECTOMY      Medical History: Past Medical History:  Diagnosis Date   Depression     Family History: Family History  Problem Relation Age of Onset   Heart disease Mother    Hypothyroidism Mother     Social History   Socioeconomic History   Marital status: Single    Spouse name: Not on file   Number of children: Not on file   Years of education: Not on file   Highest education level: Not on file  Occupational History   Not on file  Tobacco Use   Smoking status: Former    Types: E-cigarettes   Smokeless tobacco: Never  Substance and Sexual Activity   Alcohol use: Never   Drug use: Never   Sexual activity: Not on file  Other Topics Concern   Not on file  Social History Narrative   Not on file   Social Determinants of Health   Financial Resource Strain: Not on file  Food Insecurity: Not on file  Transportation Needs: Not on file  Physical Activity: Not on file  Stress: Not on file  Social Connections: Not on file  Intimate Partner Violence: Not on file      Review of Systems  Constitutional:  Positive for fatigue. Negative for chills and unexpected weight change.  HENT:  Negative for congestion, postnasal drip, rhinorrhea, sneezing and sore throat.   Eyes:  Negative for redness.  Respiratory:  Negative for cough, chest tightness and shortness of breath.   Cardiovascular:  Negative for chest pain and palpitations.  Gastrointestinal:  Negative for abdominal pain, constipation, diarrhea, nausea and  vomiting.  Genitourinary:  Negative for dysuria and frequency.  Musculoskeletal:  Negative for arthralgias, back pain, joint swelling and neck pain.  Skin:  Negative for rash.  Neurological: Negative.  Negative for tremors and numbness.  Hematological:  Negative for adenopathy. Does not bruise/bleed easily.  Psychiatric/Behavioral:  Positive for behavioral problems (Depression). Negative for sleep disturbance and suicidal ideas. The patient is nervous/anxious.    Vital Signs: BP 120/80   Pulse 84   Temp 97.8 F (36.6 C)   Resp 16   Ht '5\' 6"'$  (1.676 m)   Wt 215 lb (97.5 kg)   SpO2 98%   BMI 34.70 kg/m    Physical Exam Vitals and nursing note reviewed.  Constitutional:      General: She is not in acute distress.    Appearance: She is well-developed. She is obese. She is not diaphoretic.  HENT:     Head: Normocephalic and atraumatic.     Mouth/Throat:     Pharynx: No oropharyngeal exudate.  Eyes:     Pupils: Pupils are equal, round, and reactive to light.  Neck:     Thyroid: No thyromegaly.     Vascular: No JVD.     Trachea: No tracheal deviation.  Cardiovascular:     Rate and Rhythm: Normal rate and regular rhythm.     Heart sounds: Normal heart sounds. No murmur heard.   No friction rub. No gallop.  Pulmonary:     Effort: Pulmonary effort is normal. No respiratory distress.     Breath sounds: No wheezing or rales.  Chest:     Chest wall: No tenderness.  Abdominal:     General: Bowel sounds are normal.     Palpations: Abdomen is soft.  Musculoskeletal:        General: Normal range of motion.     Cervical back: Normal range of motion and neck supple.  Lymphadenopathy:     Cervical: No cervical adenopathy.  Skin:    General: Skin is warm and dry.  Neurological:     Mental Status: She is alert and oriented to person, place, and time.     Cranial Nerves: No cranial nerve deficit.  Psychiatric:        Behavior: Behavior normal.        Thought Content: Thought  content normal.        Judgment: Judgment normal.       Assessment/Plan: 1. Depression, recurrent ([place]) Encouraged to take lexapro daily to see true benefit and then can consider increasing as necessary. Advised to set alarm for meds or have someone remind her to take them. Discussed referral to psych for further eval and treatment - Ambulatory referral to Psychiatry  2. GAD (generalized anxiety disorder) Encouraged to take lexapro daily to see true benefit and then can consider increasing as necessary. Continue hydroxyzine as needed. Advised to set alarm for meds or have someone remind her to take them. Discussed referral to psych for  further eval and treatment - Ambulatory referral to Psychiatry   General Counseling: berda coronel understanding of the findings of todays visit and agrees with plan of treatment. I have discussed any further diagnostic evaluation that may be needed or ordered today. We also reviewed her medications today. she has been encouraged to call the office with any questions or concerns that should arise related to todays visit.    Orders Placed This Encounter  Procedures   Ambulatory referral to Psychiatry    No orders of the defined types were placed in this encounter.   This patient was seen by Drema Dallas, PA-C in collaboration with Dr. Clayborn Bigness as a part of collaborative care agreement.   Total time spent:30 Minutes Time spent includes review of chart, medications, test results, and follow up plan with the patient.      Dr Lavera Guise Internal medicine

## 2020-11-29 ENCOUNTER — Other Ambulatory Visit: Payer: Self-pay | Admitting: Physician Assistant

## 2020-11-29 DIAGNOSIS — F418 Other specified anxiety disorders: Secondary | ICD-10-CM

## 2020-11-30 ENCOUNTER — Ambulatory Visit: Payer: BC Managed Care – PPO | Admitting: Physician Assistant

## 2020-12-17 ENCOUNTER — Telehealth (INDEPENDENT_AMBULATORY_CARE_PROVIDER_SITE_OTHER): Payer: BC Managed Care – PPO | Admitting: Psychiatry

## 2020-12-17 ENCOUNTER — Encounter: Payer: Self-pay | Admitting: Psychiatry

## 2020-12-17 ENCOUNTER — Other Ambulatory Visit: Payer: Self-pay

## 2020-12-17 DIAGNOSIS — G4700 Insomnia, unspecified: Secondary | ICD-10-CM | POA: Diagnosis not present

## 2020-12-17 DIAGNOSIS — F39 Unspecified mood [affective] disorder: Secondary | ICD-10-CM | POA: Diagnosis not present

## 2020-12-17 DIAGNOSIS — F411 Generalized anxiety disorder: Secondary | ICD-10-CM | POA: Diagnosis not present

## 2020-12-17 MED ORDER — TRAZODONE HCL 50 MG PO TABS: 25.0000 mg | ORAL_TABLET | Freq: Every evening | ORAL | 0 refills | Status: DC | PRN
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
## 2020-12-17 NOTE — Patient Instructions (Signed)
Trazodone Tablets What is this medication? TRAZODONE (TRAZ oh done) treats depression. It increases the amount of serotonin in the brain, a hormone that helps regulate mood. This medicine may be used for other purposes; ask your health care provider or pharmacist if you have questions. COMMON BRAND NAME(S): Desyrel What should I tell my care team before I take this medication? They need to know if you have any of these conditions: Attempted suicide or thinking about it Bipolar disorder Bleeding problems Glaucoma Heart disease, or previous heart attack Irregular heart beat Kidney or liver disease Low levels of sodium in the blood An unusual or allergic reaction to trazodone, other medications, foods, dyes or preservatives Pregnant or trying to get pregnant Breast-feeding How should I use this medication? Take this medication by mouth with a glass of water. Follow the directions on the prescription label. Take this medication shortly after a meal or a light snack. Take your medication at regular intervals. Do not take your medication more often than directed. Do not stop taking this medication suddenly except upon the advice of your care team. Stopping this medication too quickly may cause serious side effects or your condition may worsen. A special MedGuide will be given to you by the pharmacist with each prescription and refill. Be sure to read this information carefully each time. Talk to your care team regarding the use of this medication in children. Special care may be needed. Overdosage: If you think you have taken too much of this medicine contact a poison control center or emergency room at once. NOTE: This medicine is only for you. Do not share this medicine with others. What if I miss a dose? If you miss a dose, take it as soon as you can. If it is almost time for your next dose, take only that dose. Do not take double or extra doses. What may interact with this medication? Do not  take this medication with any of the following: Certain medications for fungal infections like fluconazole, itraconazole, ketoconazole, posaconazole, voriconazole Cisapride Dronedarone Linezolid MAOIs like Carbex, Eldepryl, Marplan, Nardil, and Parnate Mesoridazine Methylene blue (injected into a vein) Pimozide Saquinavir Thioridazine This medication may also interact with the following: Alcohol Antiviral medications for HIV or AIDS Aspirin and aspirin-like medications Barbiturates like phenobarbital Certain medications for blood pressure, heart disease, irregular heart beat Certain medications for depression, anxiety, or psychotic disturbances Certain medications for migraine headache like almotriptan, eletriptan, frovatriptan, naratriptan, rizatriptan, sumatriptan, zolmitriptan Certain medications for seizures like carbamazepine and phenytoin Certain medications for sleep Certain medications that treat or prevent blood clots like dalteparin, enoxaparin, warfarin Digoxin Fentanyl Lithium NSAIDS, medications for pain and inflammation, like ibuprofen or naproxen Other medications that prolong the QT interval (cause an abnormal heart rhythm) like dofetilide Rasagiline Supplements like St. John's wort, kava kava, valerian Tramadol Tryptophan This list may not describe all possible interactions. Give your health care provider a list of all the medicines, herbs, non-prescription drugs, or dietary supplements you use. Also tell them if you smoke, drink alcohol, or use illegal drugs. Some items may interact with your medicine. What should I watch for while using this medication? Tell your care team if your symptoms do not get better or if they get worse. Visit your care team for regular checks on your progress. Because it may take several weeks to see the full effects of this medication, it is important to continue your treatment as prescribed by your care team. Watch for new or worsening  thoughts of  suicide or depression. This includes sudden changes in mood, behaviors, or thoughts. These changes can happen at any time but are more common in the beginning of treatment or after a change in dose. Call your care team right away if you experience these thoughts or worsening depression. Manic episodes may happen in patients with bipolar disorder who take this medication. Watch for changes in feelings or behaviors such as feeling anxious, nervous, agitated, panicky, irritable, hostile, aggressive, impulsive, severely restless, overly excited and hyperactive, or trouble sleeping. These changes can happen at any time but are more common in the beginning of treatment or after a change in dose. Call your care team right away if you notice any of these symptoms. You may get drowsy or dizzy. Do not drive, use machinery, or do anything that needs mental alertness until you know how this medication affects you. Do not stand or sit up quickly, especially if you are an older patient. This reduces the risk of dizzy or fainting spells. Alcohol may interfere with the effect of this medication. Avoid alcoholic drinks. This medication may cause dry eyes and blurred vision. If you wear contact lenses you may feel some discomfort. Lubricating drops may help. See your eye doctor if the problem does not go away or is severe. Your mouth may get dry. Chewing sugarless gum, sucking hard candy and drinking plenty of water may help. Contact your care team if the problem does not go away or is severe. What side effects may I notice from receiving this medication? Side effects that you should report to your care team as soon as possible: Allergic reactions-skin rash, itching, hives, swelling of the face, lips, tongue, or throat Bleeding-bloody or black, tar-like stools, red or dark brown urine, vomiting blood or brown material that looks like coffee grounds, small, red or purple spots on skin, unusual bleeding or  bruising Heart rhythm changes-fast or irregular heartbeat, dizziness, feeling faint or lightheaded, chest pain, trouble breathing Low blood pressure-dizziness, feeling faint or lightheaded, blurry vision Low sodium level-muscle weakness, fatigue, dizziness, headache, confusion Prolonged or painful erection Serotonin syndrome-irritability, confusion, fast or irregular heartbeat, muscle stiffness, twitching muscles, sweating, high fever, seizures, chills, vomiting, diarrhea Sudden eye pain or change in vision such as blurry vision, seeing halos around lights, vision loss Thoughts of suicide or self-harm, worsening mood, feelings of depression Side effects that usually do not require medical attention (report to your care team if they continue or are bothersome): Change in sex drive or performance Constipation Dizziness Drowsiness Dry mouth This list may not describe all possible side effects. Call your doctor for medical advice about side effects. You may report side effects to FDA at 1-800-FDA-1088. Where should I keep my medication? Keep out of the reach of children and pets. Store at room temperature between 15 and 30 degrees C (59 to 86 degrees F). Protect from light. Keep container tightly closed. Throw away any unused medication after the expiration date. NOTE: This sheet is a summary. It may not cover all possible information. If you have questions about this medicine, talk to your doctor, pharmacist, or health care provider.  2022 Elsevier/Gold Standard (2020-01-23 14:46:11)

## 2020-12-27 ENCOUNTER — Telehealth: Payer: BC Managed Care – PPO | Admitting: Psychiatry

## 2021-01-11 ENCOUNTER — Other Ambulatory Visit: Payer: Self-pay

## 2021-01-11 ENCOUNTER — Encounter: Payer: Self-pay | Admitting: Psychiatry

## 2021-01-11 ENCOUNTER — Telehealth (INDEPENDENT_AMBULATORY_CARE_PROVIDER_SITE_OTHER): Payer: BC Managed Care – PPO | Admitting: Psychiatry

## 2021-01-11 DIAGNOSIS — G4700 Insomnia, unspecified: Secondary | ICD-10-CM

## 2021-01-11 DIAGNOSIS — F411 Generalized anxiety disorder: Secondary | ICD-10-CM

## 2021-01-11 DIAGNOSIS — F39 Unspecified mood [affective] disorder: Secondary | ICD-10-CM

## 2021-01-11 MED ORDER — DOXEPIN HCL 10 MG PO CAPS: 10.0000 mg | ORAL_CAPSULE | Freq: Every evening | ORAL | 1 refills | Status: DC | PRN

## 2021-01-11 MED ORDER — ESCITALOPRAM OXALATE 5 MG PO TABS: 10.0000 mg | ORAL_TABLET | Freq: Every day | ORAL | 0 refills | Status: DC

## 2021-01-11 NOTE — Patient Instructions (Signed)
Doxepin Capsules What is this medication? DOXEPIN (DOX e pin) treats depression and anxiety. It increases the amount of serotonin and norepinephrine in the brain, hormones that help regulate mood. It belongs to a group of medications called tricyclic antidepressants (TCAs). This medicine Moffat be used for other purposes; ask your health care provider or pharmacist if you have questions. COMMON BRAND NAME(S): Sinequan What should I tell my care team before I take this medication? They need to know if you have any of these conditions: Bipolar disorder Difficulty passing urine Glaucoma Heart disease If you frequently drink alcohol containing drinks Liver disease Lung or breathing disease, like asthma or sleep apnea Prostate trouble Schizophrenia Seizures Suicidal thoughts, plans, or attempt; a previous suicide attempt by you or a family member An unusual or allergic reaction to doxepin, other medications, foods, dyes, or preservatives Pregnant or trying to get pregnant Breast-feeding How should I use this medication? Take this medication by mouth with a glass of water. Follow the directions on the prescription label. Take your doses at regular intervals. Do not take your medication more often than directed. Do not stop taking this medication suddenly except upon the advice of your care team. Stopping this medication too quickly Goonan cause serious side effects or your condition Hedger worsen. A special MedGuide will be given to you by the pharmacist with each prescription and refill. Be sure to read this information carefully each time. Talk to your care team about the use of this medication in children. While this medication Eschete be prescribed for children as young as 12 years for selected conditions, precautions do apply. Overdosage: If you think you have taken too much of this medicine contact a poison control center or emergency room at once. NOTE: This medicine is only for you. Do not share this  medicine with others. What if I miss a dose? If you miss a dose, take it as soon as you can. If it is almost time for your next dose, take only that dose. Do not take double or extra doses. What Bracewell interact with this medication? Do not take this medication with any of the following: Arsenic trioxide Certain medications used to regulate abnormal heartbeat or to treat other heart conditions Cisapride Halofantrine Levomethadyl Linezolid MAOIs like Carbex, Eldepryl, Marplan, Nardil, and Parnate Methylene blue Other medications for mental depression Phenothiazines like perphenazine, thioridazine and chlorpromazine Pimozide Procarbazine Sparfloxacin St. John's Wort This medication Donnan also interact with the following: Cimetidine Tolazamide Ziprasidone This list Fellers not describe all possible interactions. Give your health care provider a list of all the medicines, herbs, non-prescription drugs, or dietary supplements you use. Also tell them if you smoke, drink alcohol, or use illegal drugs. Some items Crammer interact with your medicine. What should I watch for while using this medication? Visit your care team for regular checks on your progress. It can take several days before you feel the full effect of this medication. If you have been taking this medication regularly for some time, do not suddenly stop taking it. You must gradually reduce the dose or you Abalos get severe side effects. Ask your care team for advice. Even after you stop taking this medication it can still affect your body for several days. Patients and their families should watch out for new or worsening thoughts of suicide or depression. Also watch out for sudden changes in feelings such as feeling anxious, agitated, panicky, irritable, hostile, aggressive, impulsive, severely restless, overly excited and hyperactive, or not being able  to sleep. If this happens, especially at the beginning of treatment or after a change in dose,  call your care team. You may get drowsy or dizzy. Do not drive, use machinery, or do anything that needs mental alertness until you know how this medication affects you. Do not stand or sit up quickly, especially if you are an older patient. This reduces the risk of dizzy or fainting spells. Alcohol may increase dizziness and drowsiness. Avoid alcoholic drinks. Do not treat yourself for coughs, colds, or allergies without asking your care team for advice. Some ingredients can increase possible side effects. Your mouth may get dry. Chewing sugarless gum or sucking hard candy, and drinking plenty of water may help. Contact your care team if the problem does not go away or is severe. This medication may cause dry eyes and blurred vision. If you wear contact lenses you may feel some discomfort. Lubricating drops may help. See your care team if the problem does not go away or is severe. This medication can make you more sensitive to the sun. Keep out of the sun. If you cannot avoid being in the sun, wear protective clothing and use sunscreen. Do not use sun lamps or tanning beds/booths. What side effects may I notice from receiving this medication? Side effects that you should report to your care team as soon as possible: Allergic reactions-skin rash, itching, hives, swelling of the face, lips, tongue, or throat Irritability, confusion, fast or irregular heartbeat, muscle stiffness, twitching muscles, sweating, high fever, seizure, chills, vomiting, diarrhea, which may be signs of serotonin syndrome Sudden eye pain or change in vision such as blurry vision, seeing halos around lights, vision loss Thoughts of suicide or self-harm, worsening mood, or feelings of depression Trouble passing urine Side effects that usually do not require medical attention (report to your care team if they continue or are bothersome): Change in sex drive or performance Constipation Dizziness Drowsiness Dry  mouth Tremors Weight gain This list may not describe all possible side effects. Call your doctor for medical advice about side effects. You may report side effects to FDA at 1-800-FDA-1088. Where should I keep my medication? Keep out of the reach of children. Store at room temperature between 15 and 30 degrees C (59 and 86 degrees F). Throw away any unused medication after the expiration date. NOTE: This sheet is a summary. It may not cover all possible information. If you have questions about this medicine, talk to your doctor, pharmacist, or health care provider.  2022 Elsevier/Gold Standard (2020-05-09 10:53:41)

## 2021-01-11 NOTE — Progress Notes (Signed)
Virtual Visit via Video Note  I connected with Jordan Riley on 01/11/21 at 10:40 AM EDT by a video enabled telemedicine application and verified that I am speaking with the correct person using two identifiers.  Location Provider Location : [facility] Patient Location : Home  Participants: Patient , Provider   I discussed the limitations of evaluation and management by telemedicine and the availability of in person appointments. The patient expressed understanding and agreed to proceed.   I discussed the assessment and treatment plan with the patient. The patient was provided an opportunity to ask questions and all were answered. The patient agreed with the plan and demonstrated an understanding of the instructions.   The patient was advised to call back or seek an in-person evaluation if the symptoms worsen or if the condition fails to improve as anticipated.    [place] MD OP Progress Note  01/11/2021 12:57 PM Caren Garske  MRN:  [id number]  Chief Complaint:  Chief Complaint   Follow-up; Anxiety    HPI: Jordan Riley is a 20 year old Caucasian female, lives in [place] with her boyfriend, employed with Psychologist, counselling, Ship broker at [employer], has a history of GAD, episodic mood disorder, insomnia, was 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  Patient presented for follow-up appointment.  Patient today reports she continues to struggle with sleep.  She tried the trazodone however it did not help her to fall asleep and finally when she did fell asleep she felt groggy the next day.  She stopped taking it.  Patient reports the Lexapro has definitely helped at this dose with her mood swings, irritability.  She has been taking it more frequently and is compliant on it.  That definitely helped.  Patient however continues to have anxiety symptoms, worries a lot, feels restless and nervous.  She is interested in dosage increase.  Patient denies any side effects to Lexapro.  Patient denies any suicidality  or homicidality or perceptual disturbances.  Patient reports work is going well.  Patient has been noncompliant with TSH labs however agrees to get it done soon.  Reports she has not been able to find a therapist.  She is motivated to start therapy.  Will continue to search for a therapist.  Patient denies any other concerns today.  Visit Diagnosis:    ICD-10-CM   1. GAD (generalized anxiety disorder)  F41.1 escitalopram (LEXAPRO) 5 MG tablet    2. Episodic mood disorder (HCC)  F39     3. Insomnia, unspecified type  G47.00 doxepin (SINEQUAN) 10 MG capsule      Past Psychiatric History: Reviewed past psychiatric history from progress note on 12/18/2018.  Past trials of Zoloft, Lexapro  Past Medical History:  Past Medical History:  Diagnosis Date   Anxiety    Depression     Past Surgical History:  Procedure Laterality Date   TONSILLECTOMY AND ADENOIDECTOMY      Family Psychiatric History: Reviewed family psychiatric history from progress note on 12/17/2020  Family History:  Family History  Problem Relation Age of Onset   Bipolar disorder Mother    Heart disease Mother    Hypothyroidism Mother    Mental illness Sister    Mental illness Maternal Grandfather    Mental illness Maternal Grandmother     Social History: Reviewed social history from progress note on 12/17/2020 Social History   Socioeconomic History   Marital status: Single    Spouse name: Not on file   Number of children: Not on file   Years of  education: Not on file   Highest education level: Not on file  Occupational History   Not on file  Tobacco Use   Smoking status: Former    Types: E-cigarettes   Smokeless tobacco: Never  Vaping Use   Vaping Use: Former   Substances: Nicotine  Substance and Sexual Activity   Alcohol use: Never   Drug use: Never   Sexual activity: Yes  Other Topics Concern   Not on file  Social History Narrative   Currently doing associates and employed full time    Social Determinants of Radio broadcast assistant Strain: Not on file  Food Insecurity: Not on file  Transportation Needs: Not on file  Physical Activity: Not on file  Stress: Not on file  Social Connections: Not on file    Allergies:  Allergies  Allergen Reactions   Shellfish Allergy Anaphylaxis   Cefdinir Nausea And Vomiting    Metabolic Disorder Labs: No results found for: HGBA1C, MPG No results found for: PROLACTIN Lab Results  Component Value Date   CHOL 188 (H) 09/22/2019   TRIG 107 (H) 09/22/2019   HDL 51 09/22/2019   LDLCALC 118 (H) 09/22/2019   Lab Results  Component Value Date   TSH 3.880 09/22/2019    Therapeutic Level Labs: No results found for: LITHIUM No results found for: VALPROATE No components found for:  CBMZ  Current Medications: Current Outpatient Medications  Medication Sig Dispense Refill   doxepin (SINEQUAN) 10 MG capsule Take 1-2 capsules (10-20 mg total) by mouth at bedtime as needed. For sleep 60 capsule 1   clindamycin (CLINDAGEL) 1 % gel Apply topically 2 (two) times daily. 60 g 1   doxycycline (VIBRA-TABS) 100 MG tablet Take 100 mg by mouth 2 (two) times daily. (Patient not taking: Reported on 12/17/2020)     escitalopram (LEXAPRO) 5 MG tablet Take 2 tablets (10 mg total) by mouth daily. 60 tablet 0   fluticasone (FLONASE) 50 MCG/ACT nasal spray Place into the nose.     hydrOXYzine (VISTARIL) 25 MG capsule Take 1 capsule (25 mg total) by mouth every 8 (eight) hours as needed. 30 capsule 0   Norethin Ace-Eth Estrad-FE (LOESTRIN FE 1/20 PO) Take 1 tablet by mouth daily. (Patient not taking: Reported on 12/17/2020)     ondansetron (ZOFRAN-ODT) 4 MG disintegrating tablet Take 1 tablet by mouth every 8 (eight) hours as needed. (Patient not taking: Reported on 12/17/2020)     predniSONE (STERAPRED UNI-PAK 21 TAB) 10 MG (21) TBPK tablet PLEASE SEE ATTACHED FOR DETAILED DIRECTIONS (Patient not taking: Reported on 12/17/2020)     No current  facility-administered medications for this visit.     Musculoskeletal: Strength & Muscle Tone:  UTA Gait & Station:  Seated Patient leans: N/A  Psychiatric Specialty Exam: Review of Systems  Psychiatric/Behavioral:  Positive for sleep disturbance. The patient is nervous/anxious.   All other systems reviewed and are negative.  There were no vitals taken for this visit.There is no height or weight on file to calculate BMI.  General Appearance: Casual  Eye Contact:  Good  Speech:  Clear and Coherent  Volume:  Normal  Mood:  Anxious  Affect:  Congruent  Thought Process:  Goal Directed and Descriptions of Associations: Intact  Orientation:  Full (Time, Place, and Person)  Thought Content: Logical   Suicidal Thoughts:  No  Homicidal Thoughts:  No  Memory:  Immediate;   Fair Recent;   Fair Remote;   Fair  Judgement:  Fair  Insight:  Fair  Psychomotor Activity:  Normal  Concentration:  Concentration: Fair and Attention Span: Fair  Recall:  [employer] of Knowledge: Fair  Language: Fair  Akathisia:  No  Handed:  Right  AIMS (if indicated): done, 0  Assets:  Communication Skills Desire for Improvement Housing Resilience Social Support  ADL's:  Intact  Cognition: WNL  Sleep:  Poor   Screenings: GAD-7    Flowsheet Row Video Visit from 12/17/2020 in [facility]  Total GAD-7 Score 18      PHQ2-9    [place] Video Visit from 12/17/2020 in [place] Office Visit from 11/05/2020 in [facility] Office Visit from 08/27/2020 in [facility], [place] from 06/27/2020 in [facility], [facility] Office Visit from 02[facility]27/2020 in [facility], [facility]  PHQ-2 Total Score 1 0 1 0 2  PHQ-9 Total Score 10 -- -- -- --      Flowsheet Row Video Visit from 12/17/2020 in [facility] from 08/29/2020 in [place] No Risk No Risk        Assessment and Plan: Stephanine Reas is a 76 year old Caucasian female who lives in [place] with her boyfriend, employed full-time, Ship broker at [employer] was 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  Patient continues to struggle with sleep as well as anxiety although making some progress with her mood, will benefit from the following plan.  Plan GAD-unstable Increase Lexapro to 10 mg p.o. daily. Patient has supplies at home. Hydroxyzine 25 mg p.o. 3 times daily as needed, has been using it limitedly and it does help. Has been referred for CBT, has not been able to establish care with a therapist yet.  Provided information for [employer] solutions.  Episodic mood disorder-improving We will monitor closely Continue hydroxyzine 25 mg p.o. 3 times daily as needed Continue Lexapro as prescribed  Insomnia unspecified-unstable Discontinue trazodone for side effects. Start doxepin 10 to 20 mg p.o. nightly as needed Provided education about side effects.  Also discussed serotonin syndrome. Patient to continue to work on sleep hygiene.  Labs pending-TSH.  Encouraged compliance.  Follow-up in clinic in 2 weeks or sooner in office.  This note was generated in part or whole with voice recognition software. Voice recognition is usually quite accurate but there are transcription errors that can and very often do occur. I apologize for any typographical errors that were not detected and corrected.      Ursula Alert, MD 01/11/2021, 12:57 PM

## 2021-01-25 ENCOUNTER — Ambulatory Visit: Payer: BC Managed Care – PPO | Admitting: Psychiatry

## 2021-01-29 ENCOUNTER — Ambulatory Visit (INDEPENDENT_AMBULATORY_CARE_PROVIDER_SITE_OTHER): Payer: BC Managed Care – PPO | Admitting: Nurse Practitioner

## 2021-01-29 ENCOUNTER — Encounter: Payer: Self-pay | Admitting: Nurse Practitioner

## 2021-01-29 ENCOUNTER — Other Ambulatory Visit: Payer: Self-pay

## 2021-01-29 VITALS — BP 119/82 | HR 83 | Temp 98.4°F | Resp 16 | Ht 66.0 in | Wt 215.0 lb

## 2021-01-29 DIAGNOSIS — E039 Hypothyroidism, unspecified: Secondary | ICD-10-CM | POA: Diagnosis not present

## 2021-01-29 DIAGNOSIS — L538 Other specified erythematous conditions: Secondary | ICD-10-CM

## 2021-01-29 NOTE — Progress Notes (Signed)
[facility] [place],  [address]  Internal MEDICINE  Office Visit Note  Patient Name: Jordan Riley  [id number]  [id number]  Date of Service: 01/29/2021  Chief Complaint  Patient presents with   Acute Visit   Rash    Started at hands, and on forearms, inside of feet and legs up to insides of thigh, didn't feel raised, not itchy, feels slightly tingly, splotchy red marks, noticed it today     HPI Jordan Riley presents for an acute sick visit for a rash that she noticed this morning. Her bilateral wrists have been itchy for the past month with no rash. This morning she has a rash that started on her hands, noted that it spread to her forearms some, and that there were also spots on her medial aspect of her feet, and medial thighs bilaterally. She states that the rash is not itchy, but feels kind of tingly, and the areas are splotchy red.      Current Medication:  Outpatient Encounter Medications as of 01/29/2021  Medication Sig   clindamycin (CLINDAGEL) 1 % gel Apply topically 2 (two) times daily.   escitalopram (LEXAPRO) 5 MG tablet Take 2 tablets (10 mg total) by mouth daily.   fluticasone (FLONASE) 50 MCG/ACT nasal spray Place into the nose.   hydrOXYzine (VISTARIL) 25 MG capsule Take 1 capsule (25 mg total) by mouth every 8 (eight) hours as needed.   predniSONE (STERAPRED UNI-PAK 21 TAB) 10 MG (21) TBPK tablet    [DISCONTINUED] doxepin (SINEQUAN) 10 MG capsule Take 1-2 capsules (10-20 mg total) by mouth at bedtime as needed. For sleep (Patient not taking: Reported on 01/29/2021)   [DISCONTINUED] doxycycline (VIBRA-TABS) 100 MG tablet Take 100 mg by mouth 2 (two) times daily. (Patient not taking: No sig reported)   [DISCONTINUED] Norethin Ace-Eth Estrad-FE (LOESTRIN FE 1/20 PO) Take 1 tablet by mouth daily. (Patient not taking: No sig reported)   [DISCONTINUED] ondansetron (ZOFRAN-ODT) 4 MG disintegrating tablet Take 1 tablet by mouth every 8 (eight)  hours as needed. (Patient not taking: No sig reported)   No facility-administered encounter medications on file as of 01/29/2021.      Medical History: Past Medical History:  Diagnosis Date   Anxiety    Depression      Vital Signs: BP 119/82   Pulse 83   Temp 98.4 F (36.9 C)   Resp 16   Ht 5' 6"  (1.676 m)   Wt 215 lb (97.5 kg)   SpO2 98%   BMI 34.70 kg/m    Review of Systems  Constitutional:  Negative for chills, fatigue and unexpected weight change.  HENT:  Negative for congestion, rhinorrhea, sneezing and sore throat.   Eyes:  Negative for redness.  Respiratory:  Negative for cough, chest tightness and shortness of breath.   Cardiovascular:  Negative for chest pain and palpitations.  Gastrointestinal:  Negative for abdominal pain, constipation, diarrhea, nausea and vomiting.  Genitourinary:  Negative for dysuria and frequency.  Musculoskeletal:  Negative for arthralgias, back pain, joint swelling and neck pain.  Skin:  Positive for rash (red splotchy rash, not itchy).  Neurological: Negative.  Negative for tremors and numbness.  Hematological:  Negative for adenopathy. Does not bruise/bleed easily.  Psychiatric/Behavioral:  Negative for behavioral problems (Depression), sleep disturbance and suicidal ideas. The patient is not nervous/anxious.    Physical Exam Vitals reviewed.  Constitutional:      General: She is not in acute distress.    Appearance: Normal  appearance. She is obese. She is not ill-appearing.  HENT:     Head: Normocephalic and atraumatic.  Eyes:     Pupils: Pupils are equal, round, and reactive to light.  Cardiovascular:     Rate and Rhythm: Normal rate and regular rhythm.  Pulmonary:     Effort: Pulmonary effort is normal. No respiratory distress.  Skin:    Findings: Rash present. Rash is macular (splotchy and red).  Neurological:     Mental Status: She is alert and oriented to person, place, and time.     Cranial Nerves: No cranial nerve  deficit.     Coordination: Coordination normal.     Gait: Gait normal.  Psychiatric:        Mood and Affect: Mood normal.        Behavior: Behavior normal.      Assessment/Plan: 1. Macular erythematous rash Labs ordered to rule out other etiology. Most likely is a viral rash. Will assess for MMR immunity.  - CBC with Differential/Platelet - Measles/Mumps/Rubella Immunity  2. Hypothyroidism, unspecified type Recheck thyroid labs to rule out hypothyroidism per her psychiatrist's request.  - TSH + free T4   General Counseling: [name] verbalizes understanding of the findings of todays visit and agrees with plan of treatment. I have discussed any further diagnostic evaluation that may be needed or ordered today. We also reviewed her medications today. she has been encouraged to call the office with any questions or concerns that should arise related to todays visit.    Counseling:    Orders Placed This Encounter  Procedures   CBC with Differential/Platelet   Measles/Mumps/Rubella Immunity   TSH + free T4    No orders of the defined types were placed in this encounter.   Return if symptoms worsen or fail to improve.  [place] Controlled Substance Database was reviewed by me for overdose risk score (ORS)  Time spent:20 Minutes Time spent with patient included reviewing progress notes, labs, imaging studies, and discussing plan for follow up.   This patient was seen by Jonetta Osgood, FNP-C in collaboration with Dr. Clayborn Bigness as a part of collaborative care agreement.  [name], MSN, FNP-C Internal Medicine

## 2021-01-30 LAB — CBC WITH DIFFERENTIAL/PLATELET
Basophils Absolute: 0.1 10*3/uL (ref 0.0–0.2)
Basos: 1 %
EOS (ABSOLUTE): 0.2 10*3/uL (ref 0.0–0.4)
Eos: 2 %
Hematocrit: 40.6 % (ref 34.0–46.6)
Hemoglobin: 14 g/dL (ref 11.1–15.9)
Immature Grans (Abs): 0 10*3/uL (ref 0.0–0.1)
Immature Granulocytes: 0 %
Lymphocytes Absolute: 2.2 10*3/uL (ref 0.7–3.1)
Lymphs: 21 %
MCH: 29 pg (ref 26.6–33.0)
MCHC: 34.5 g/dL (ref 31.5–35.7)
MCV: 84 fL (ref 79–97)
Monocytes Absolute: 0.7 10*3/uL (ref 0.1–0.9)
Monocytes: 7 %
Neutrophils Absolute: 7.1 10*3/uL — ABNORMAL HIGH (ref 1.4–7.0)
Neutrophils: 69 %
Platelets: 274 10*3/uL (ref 150–450)
RBC: 4.82 x10E6/uL (ref 3.77–5.28)
RDW: 12.9 % (ref 11.7–15.4)
WBC: 10.2 10*3/uL (ref 3.4–10.8)

## 2021-01-30 LAB — TSH+FREE T4
Free T4: 1.61 ng/dL — ABNORMAL HIGH (ref 0.93–1.60)
TSH: 1.85 u[IU]/mL (ref 0.450–4.500)

## 2021-01-30 LAB — MEASLES/MUMPS/RUBELLA IMMUNITY
MUMPS ABS, IGG: 225 AU/mL (ref >10.9)
RUBEOLA AB, IGG: 51.1 AU/mL (ref >16.4)
Rubella Antibodies, IGG: 2.51 index (ref >0.99)

## 2021-02-04 ENCOUNTER — Ambulatory Visit (INDEPENDENT_AMBULATORY_CARE_PROVIDER_SITE_OTHER): Payer: BC Managed Care – PPO | Admitting: Physician Assistant

## 2021-02-04 ENCOUNTER — Other Ambulatory Visit: Payer: Self-pay

## 2021-02-04 ENCOUNTER — Encounter: Payer: Self-pay | Admitting: Physician Assistant

## 2021-02-04 ENCOUNTER — Encounter: Payer: Self-pay | Admitting: Psychiatry

## 2021-02-04 ENCOUNTER — Ambulatory Visit (INDEPENDENT_AMBULATORY_CARE_PROVIDER_SITE_OTHER): Payer: BC Managed Care – PPO | Admitting: Psychiatry

## 2021-02-04 VITALS — BP 157/82 | HR 76 | Temp 98.3°F | Wt 214.4 lb

## 2021-02-04 VITALS — BP 120/72 | HR 82 | Temp 98.0°F | Resp 16 | Ht 66.0 in | Wt 215.2 lb

## 2021-02-04 DIAGNOSIS — G4700 Insomnia, unspecified: Secondary | ICD-10-CM | POA: Diagnosis not present

## 2021-02-04 DIAGNOSIS — F339 Major depressive disorder, recurrent, unspecified: Secondary | ICD-10-CM

## 2021-02-04 DIAGNOSIS — F411 Generalized anxiety disorder: Secondary | ICD-10-CM

## 2021-02-04 DIAGNOSIS — R7989 Other specified abnormal findings of blood chemistry: Secondary | ICD-10-CM

## 2021-02-04 DIAGNOSIS — F39 Unspecified mood [affective] disorder: Secondary | ICD-10-CM

## 2021-02-04 DIAGNOSIS — R030 Elevated blood-pressure reading, without diagnosis of hypertension: Secondary | ICD-10-CM

## 2021-02-04 MED ORDER — ESCITALOPRAM OXALATE 10 MG PO TABS: 10.0000 mg | ORAL_TABLET | Freq: Every day | ORAL | 1 refills | Status: DC

## 2021-02-04 MED ORDER — PROPRANOLOL HCL 10 MG PO TABS
10.0000 mg | ORAL_TABLET | Freq: Two times a day (BID) | ORAL | 1 refills | Status: DC | PRN
Start: 2021-02-04 — End: 2021-04-24

## 2021-02-04 NOTE — Progress Notes (Signed)
[facility] [place], [place] [address]  Internal MEDICINE  Office Visit Note  Patient Name: Jordan Riley  [id number]  [id number]  Date of Service: 02/10/2021  Chief Complaint  Patient presents with   Results    lab   Follow-up    3 month   Quality Metric Gaps    Chlamydia screening, HIV screen, Hep C Screen    HPI Pt is here for routine follow up -Rash is improving, only a few places left. Started improving the day after it started. -Labs reviewed and mostly normal with slight elevation in neutrophils and free T4. Will recheck labs in 6 weeks since borderline abnormal and consider [place] if still abnormal -Psych follow up this afternoon, also going to be seeing therapist in [place]. She was tried on a sleeping medication and it hit too late and made her drowsy in Am so stopped it and sleep has improved. She states that psychiatry plans to increase lexapro to 10mg , but she has not picked up higher dose yet. -Anxiety is better but still gets irritable  Current Medication: Outpatient Encounter Medications as of 02/04/2021  Medication Sig   clindamycin (CLINDAGEL) 1 % gel Apply topically 2 (two) times daily.   fluticasone (FLONASE) 50 MCG/ACT nasal spray Place into the nose.   [DISCONTINUED] escitalopram (LEXAPRO) 5 MG tablet Take 2 tablets (10 mg total) by mouth daily.   [DISCONTINUED] hydrOXYzine (VISTARIL) 25 MG capsule Take 1 capsule (25 mg total) by mouth every 8 (eight) hours as needed.   [DISCONTINUED] predniSONE (STERAPRED UNI-PAK 21 TAB) 10 MG (21) TBPK tablet  (Patient not taking: Reported on 02/04/2021)   No facility-administered encounter medications on file as of 02/04/2021.    Surgical History: Past Surgical History:  Procedure Laterality Date   TONSILLECTOMY AND ADENOIDECTOMY      Medical History: Past Medical History:  Diagnosis Date   Anxiety    Depression     Family History: Family History  Problem Relation Age of Onset   Bipolar  disorder Mother    Heart disease Mother    Hypothyroidism Mother    Mental illness Sister    Mental illness Maternal Grandfather    Mental illness Maternal Grandmother     Social History   Socioeconomic History   Marital status: Single    Spouse name: Not on file   Number of children: Not on file   Years of education: Not on file   Highest education level: Not on file  Occupational History   Not on file  Tobacco Use   Smoking status: Former    Types: E-cigarettes   Smokeless tobacco: Never  Vaping Use   Vaping Use: Former   Substances: Nicotine  Substance and Sexual Activity   Alcohol use: Never   Drug use: Never   Sexual activity: Yes  Other Topics Concern   Not on file  Social History Narrative   Currently doing associates and employed full time   Social Determinants of Radio broadcast assistant Strain: Not on file  Food Insecurity: Not on file  Transportation Needs: Not on file  Physical Activity: Not on file  Stress: Not on file  Social Connections: Not on file  Intimate Partner Violence: Not on file      Review of Systems  Constitutional:  Positive for fatigue. Negative for chills and unexpected weight change.  HENT:  Negative for congestion, postnasal drip, rhinorrhea, sneezing and sore throat.   Eyes:  Negative for redness.  Respiratory:  Negative for cough, chest tightness and shortness of breath.   Cardiovascular:  Negative for chest pain and palpitations.  Gastrointestinal:  Negative for abdominal pain, constipation, diarrhea, nausea and vomiting.  Genitourinary:  Negative for dysuria and frequency.  Musculoskeletal:  Negative for arthralgias, back pain, joint swelling and neck pain.  Skin:  Negative for rash.  Neurological: Negative.  Negative for tremors and numbness.  Hematological:  Negative for adenopathy. Does not bruise/bleed easily.  Psychiatric/Behavioral:  Positive for behavioral problems (Depression). Negative for sleep disturbance and  suicidal ideas. The patient is nervous/anxious.    Vital Signs: BP 120/72   Pulse 82   Temp 98 F (36.7 C)   Resp 16   Ht 5\' 6"  (1.676 m)   Wt 215 lb 3.2 oz (97.6 kg)   SpO2 97%   BMI 34.73 kg/m    Physical Exam Vitals and nursing note reviewed.  Constitutional:      General: She is not in acute distress.    Appearance: She is well-developed. She is obese. She is not diaphoretic.  HENT:     Head: Normocephalic and atraumatic.     Mouth/Throat:     Pharynx: No oropharyngeal exudate.  Eyes:     Pupils: Pupils are equal, round, and reactive to light.  Neck:     Thyroid: No thyromegaly.     Vascular: No JVD.     Trachea: No tracheal deviation.  Cardiovascular:     Rate and Rhythm: Normal rate and regular rhythm.     Heart sounds: Normal heart sounds. No murmur heard.   No friction rub. No gallop.  Pulmonary:     Effort: Pulmonary effort is normal. No respiratory distress.     Breath sounds: No wheezing or rales.  Chest:     Chest wall: No tenderness.  Abdominal:     General: Bowel sounds are normal.     Palpations: Abdomen is soft.  Musculoskeletal:        General: Normal range of motion.     Cervical back: Normal range of motion and neck supple.  Lymphadenopathy:     Cervical: No cervical adenopathy.  Skin:    General: Skin is warm and dry.  Neurological:     Mental Status: She is alert and oriented to person, place, and time.     Cranial Nerves: No cranial nerve deficit.  Psychiatric:        Behavior: Behavior normal.        Thought Content: Thought content normal.        Judgment: Judgment normal.       Assessment/Plan: 1. Abnormal thyroid blood test Will recheck labs in 6 weeks, may need [place] if abnormal - TSH + free T4  2. Depression, recurrent ([place]) Improving on lexapro, followed by psych now  3. GAD (generalized anxiety disorder) Followed by psych   General Counseling: Birdie Sons understanding of the findings of todays visit and agrees  with plan of treatment. I have discussed any further diagnostic evaluation that may be needed or ordered today. We also reviewed her medications today. she has been encouraged to call the office with any questions or concerns that should arise related to todays visit.    Orders Placed This Encounter  Procedures   TSH + free T4     No orders of the defined types were placed in this encounter.   This patient was seen by Drema Dallas, PA-C in collaboration with Dr. Clayborn Bigness as a part  of collaborative care agreement.   Total time spent:30 Minutes Time spent includes review of chart, medications, test results, and follow up plan with the patient.      Dr Lavera Guise Internal medicine

## 2021-02-04 NOTE — Progress Notes (Signed)
[facility] MD OP Progress Note  02/04/2021 4:18 PM Harvest Deist  MRN:  [id number]  Chief Complaint:  Chief Complaint   Follow-up; Anxiety    HPI: Jordan Riley is a 20 year old Caucasian female, lives in [place], has a history of GAD, episodic mood disorder, insomnia, employed, with [employer], Ship broker at [employer], presents for a follow-up visit.  Patient reports she recently broke up with her boyfriend.  She feels very overwhelmed and became tearful when she discussed it.  She hence has been having worsening anxiety symptoms, anxiety attacks when she feels like she has a chest pain and feels nervous.  This kind of happens almost every day now.  Patient recently broke out in hives all over her hand as well as lower extremities.  Reviewed notes per Ms.Abernathy dated 01/29/2021, patient with macular erythematous rash, labs ordered, most likely viral.  CBC with differential as well as measles/mom/rubella immunity ordered.'  All her labs returned normal except for slightly elevated neutrophil count.  Patient currently reports rash resolved after a day.  She reports she was told that it may have been stress induced.  Patient reports sleep has improved since she started taking the Lexapro at night.  She continues to take the Lexapro 5 mg and did not increase it as discussed last visit.  She reports she has a history of skipping her medication some days however lately she has been more consistent in taking it.  She has this App that helps her to remember taking her medication.  Patient denies any suicidality, homicidality or perceptual disturbances.  Patient has her first appointment with her therapist to start CBT in January 2023.  She is scheduled to see Ms. Miguel Dibble.  When referral to an intensive outpatient program was discussed given her multiple psychosocial stressors and worsening anxiety symptoms, patient is agreeable.  Patient denies any other concerns today.  Visit Diagnosis:     ICD-10-CM   1. GAD (generalized anxiety disorder)  F41.1 escitalopram (LEXAPRO) 10 MG tablet    propranolol (INDERAL) 10 MG tablet    2. Episodic mood disorder (HCC)  F39 escitalopram (LEXAPRO) 10 MG tablet    3. Insomnia, unspecified type  G47.00     4. Elevated blood pressure reading  R03.0       Past Psychiatric History: Reviewed past psychiatric history from progress note on 12/18/2018.  Past trials of Zoloft, Lexapro  Past Medical History:  Past Medical History:  Diagnosis Date   Anxiety    Depression     Past Surgical History:  Procedure Laterality Date   TONSILLECTOMY AND ADENOIDECTOMY      Family Psychiatric History: Reviewed family psychiatric history from progress note on 12/18/2018  Family History:  Family History  Problem Relation Age of Onset   Bipolar disorder Mother    Heart disease Mother    Hypothyroidism Mother    Mental illness Sister    Mental illness Maternal Grandfather    Mental illness Maternal Grandmother     Social History: Reviewed social history from progress note on 12/18/2018 Social History   Socioeconomic History   Marital status: Single    Spouse name: Not on file   Number of children: Not on file   Years of education: Not on file   Highest education level: Not on file  Occupational History   Not on file  Tobacco Use   Smoking status: Former    Types: E-cigarettes   Smokeless tobacco: Never  Vaping Use   Vaping Use:  Former   Substances: Nicotine  Substance and Sexual Activity   Alcohol use: Never   Drug use: Never   Sexual activity: Yes  Other Topics Concern   Not on file  Social History Narrative   Currently doing associates and employed full time   Social Determinants of Radio broadcast assistant Strain: Not on file  Food Insecurity: Not on file  Transportation Needs: Not on file  Physical Activity: Not on file  Stress: Not on file  Social Connections: Not on file    Allergies:  Allergies  Allergen  Reactions   Shellfish Allergy Anaphylaxis   Cefdinir Nausea And Vomiting    Metabolic Disorder Labs: No results found for: HGBA1C, MPG No results found for: PROLACTIN Lab Results  Component Value Date   CHOL 188 (H) 09/22/2019   TRIG 107 (H) 09/22/2019   HDL 51 09/22/2019   LDLCALC 118 (H) 09/22/2019   Lab Results  Component Value Date   TSH 1.850 01/29/2021   TSH 3.880 09/22/2019    Therapeutic Level Labs: No results found for: LITHIUM No results found for: VALPROATE No components found for:  CBMZ  Current Medications: Current Outpatient Medications  Medication Sig Dispense Refill   clindamycin (CLINDAGEL) 1 % gel Apply topically 2 (two) times daily. 60 g 1   doxepin (SINEQUAN) 10 MG capsule Take 10 mg by mouth.     escitalopram (LEXAPRO) 10 MG tablet Take 1 tablet (10 mg total) by mouth daily. 30 tablet 1   fluticasone (FLONASE) 50 MCG/ACT nasal spray Place into the nose.     propranolol (INDERAL) 10 MG tablet Take 1 tablet (10 mg total) by mouth 2 (two) times daily as needed. For severe anxiety attacks only 60 tablet 1   No current facility-administered medications for this visit.     Musculoskeletal: Strength & Muscle Tone: within normal limits Gait & Station: normal Patient leans: N/A  Psychiatric Specialty Exam: Review of Systems  Psychiatric/Behavioral:  Positive for sleep disturbance. The patient is nervous/anxious.   All other systems reviewed and are negative.  Blood pressure (!) 157/82, pulse 76, temperature 98.3 F (36.8 C), temperature source Temporal, weight 214 lb 6.4 oz (97.3 kg), last menstrual period 01/04/2021.Body mass index is 34.61 kg/m.  General Appearance: Casual  Eye Contact:  Fair  Speech:  Clear and Coherent  Volume:  Normal  Mood:  Anxious  Affect:  Tearful  Thought Process:  Goal Directed and Descriptions of Associations: Intact  Orientation:  Full (Time, Place, and Person)  Thought Content: Logical   Suicidal Thoughts:  No   Homicidal Thoughts:  No  Memory:  Immediate;   Fair Recent;   Fair Remote;   Fair  Judgement:  Fair  Insight:  Fair  Psychomotor Activity:  Normal  Concentration:  Concentration: Fair and Attention Span: Fair  Recall:  [employer] of Knowledge: Fair  Language: Fair  Akathisia:  No  Handed:  Right  AIMS (if indicated): not done  Assets:  Communication Skills Desire for Improvement Housing Social Support  ADL's:  Intact  Cognition: WNL  Sleep:   Improving   Screenings: GAD-7    Flowsheet Row Video Visit from 12/17/2020 in [place]  Total GAD-7 Score 18      PHQ2-9    [place] Visit from 02/04/2021 in [place] Most recent reading at 02/04/2021  3:34 PM Office Visit from 02/04/2021 in [facility], [facility] Most recent reading at 02/04/2021  9:33 AM Video Visit from 12/17/2020 in [place] Most recent reading at 12/17/2020  1:31 PM Office Visit from 11/05/2020 in [facility], [facility] Most recent reading at 11/05/2020  3:44 PM Office Visit from 08/27/2020 in [facility], [facility] Most recent reading at 08/27/2020  8:54 AM  PHQ-2 Total Score 3 2 1  0 1  PHQ-9 Total Score 12 -- 10 -- --      [place] Visit from 02/04/2021 in [place] Video Visit from 12/17/2020 in [place] [facility] from 08/29/2020 in [place] Error: Q3, 4, or 5 should not be populated when Q2 is No No Risk No Risk        Assessment and Plan: Chantelle Verdi is a 20 year old Caucasian female who lives in [place], employed full-time, 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  Patient is currently struggling with worsening anxiety symptoms, recent break-up with boyfriend, noncompliance with recommendations with regards to  medications.  Patient agreeable to referral to intensive outpatient program.  Plan as noted below.  Plan GAD-unstable Increase Lexapro to 10 mg p.o. daily. Discontinue hydroxyzine for lack of benefit Start propranolol 10 mg p.o. twice daily as needed for severe anxiety attacks.  Patient advised to limit use I will refer patient for IOP-sent communication to Ms. [employer]. Patient has upcoming appointment in January 2023 with Ms. Tina Thompson-therapist.  Episodic mood disorder-unstable Increase Lexapro to 10 mg p.o. daily Referral for intensive outpatient program  Insomnia unspecified-improving Patient does have doxepin 10 mg to 20 mg p.o. nightly as needed available.  She reports she is willing to try it as needed.  Elevated blood pressure reading-patient was noted to have elevated blood pressure reading-157/82 in session today.  However patient had her appointment [facility]-this morning-blood pressure at that time was 120/72.  Patient to monitor her blood pressure and follow up with primary care provider as needed.  TSH labs reviewed-01/29/2021-1.850-within normal limits.  Slight elevation in free T4.  Patient to recheck labs in 6 weeks.  I have reviewed notes per her primary care provider Ms. Abernathy-as noted 01/29/2021.  Follow-up in clinic in 2 weeks or sooner if needed in person.  This note was generated in part or whole with voice recognition software. Voice recognition is usually quite accurate but there are transcription errors that can and very often do occur. I apologize for any typographical errors that were not detected and corrected.       Ursula Alert, MD 02/04/2021, 4:18 PM

## 2021-02-04 NOTE — Patient Instructions (Signed)
Propranolol Tablets What is this medication? PROPRANOLOL (proe PRAN oh lole) treats many conditions such as high blood pressure, tremors, and a type of arrhythmia known as AFib (atrial fibrillation). It works by lowering your blood pressure and heart rate, making it easier for your heart to pump blood to the rest of your body. It may be used to prevent migraine headaches. It works by relaxing the blood vessels in the brain that cause migraines. It belongs to a group of medications called beta blockers. This medicine may be used for other purposes; ask your health care provider or pharmacist if you have questions. COMMON BRAND NAME(S): Inderal What should I tell my care team before I take this medication? They need to know if you have any of these conditions: Circulation problems or blood vessel disease Diabetes History of heart attack or heart disease, vasospastic angina Kidney disease Liver disease Lung or breathing disease, like asthma or emphysema Pheochromocytoma Slow heart rate Thyroid disease An unusual or allergic reaction to propranolol, other beta-blockers, medications, foods, dyes, or preservatives Pregnant or trying to get pregnant Breast-feeding How should I use this medication? Take this medication by mouth. Take it as directed on the prescription label at the same time every day. Keep taking it unless your care team tells you to stop. Talk to your care team about the use of this medication in children. Special care may be needed. Overdosage: If you think you have taken too much of this medicine contact a poison control center or emergency room at once. NOTE: This medicine is only for you. Do not share this medicine with others. What if I miss a dose? If you miss a dose, take it as soon as you can. If it is almost time for your next dose, take only that dose. Do not take double or extra doses. What may interact with this medication? Do not take this medication with any of the  following: Feverfew Phenothiazines like chlorpromazine, mesoridazine, prochlorperazine, thioridazine This medication may also interact with the following: Aluminum hydroxide gel Antipyrine Antiviral medications for HIV or AIDS Barbiturates like phenobarbital Certain medications for blood pressure, heart disease, irregular heart beat Cimetidine Ciprofloxacin Diazepam Fluconazole Haloperidol Isoniazid Medications for cholesterol like cholestyramine or colestipol Medications for mental depression Medications for migraine headache like almotriptan, eletriptan, frovatriptan, naratriptan, rizatriptan, sumatriptan, zolmitriptan NSAIDs, medications for pain and inflammation, like ibuprofen or naproxen Phenytoin Rifampin Teniposide Theophylline Thyroid medications Tolbutamide Warfarin Zileuton This list may not describe all possible interactions. Give your health care provider a list of all the medicines, herbs, non-prescription drugs, or dietary supplements you use. Also tell them if you smoke, drink alcohol, or use illegal drugs. Some items may interact with your medicine. What should I watch for while using this medication? Visit your care team for regular checks on your progress. Check your blood pressure as directed. Ask your care team what your blood pressure should be. Also, find out when you should contact him or her. Do not treat yourself for coughs, colds, or pain while you are using this medication without asking your care team for advice. Some medications may increase your blood pressure. You may get drowsy or dizzy. Do not drive, use machinery, or do anything that needs mental alertness until you know how this medication affects you. Do not stand up or sit up quickly, especially if you are an older patient. This reduces the risk of dizzy or fainting spells. Alcohol may interfere with the effect of this medication. Avoid alcoholic drinks.  This medication may increase blood sugar.  Ask your care team if changes in diet or medications are needed if you have diabetes. What side effects may I notice from receiving this medication? Side effects that you should report to your care team as soon as possible: Allergic reactions--skin rash, itching, hives, swelling of the face, lips, tongue, or throat Heart failure--shortness of breath, swelling of the ankles, feet, or hands, sudden weight gain, unusual weakness or fatigue Low blood pressure--dizziness, feeling faint or lightheaded, blurry vision Raynaud's--cool, numb, or painful fingers or toes that may change color from pale, to blue, to red Redness, blistering, peeling, or loosening of the skin, including inside the mouth Slow heartbeat--dizziness, feeling faint or lightheaded, confusion, trouble breathing, unusual weakness or fatigue Worsening mood, feelings of depression Side effects that usually do not require medical attention (report to your care team if they continue or are bothersome): Change in sex drive or performance Diarrhea Dizziness Fatigue Headache This list may not describe all possible side effects. Call your doctor for medical advice about side effects. You may report side effects to FDA at 1-800-FDA-1088. Where should I keep my medication? Keep out of the reach of children and pets. Store at room temperature between 20 and 25 degrees C (68 and 77 degrees F). Protect from light. Throw away any unused medication after the expiration date. NOTE: This sheet is a summary. It may not cover all possible information. If you have questions about this medicine, talk to your doctor, pharmacist, or health care provider.  2022 Elsevier/Gold Standard (2020-11-20 00:00:00)

## 2021-02-05 ENCOUNTER — Telehealth (HOSPITAL_COMMUNITY): Payer: Self-pay | Admitting: Psychiatry

## 2021-02-05 NOTE — Telephone Encounter (Signed)
D:  Dr. Shea Evans referred pt to [facility]-IOP/PHP.  A:  Placed call to orient pt.  Pt declined d/t conflict with her work schedule.  "I work in the mornings Mon. Thru Fri so that's not going to work."  Provided pt with [employer] # since they have an evening group. Pt states she will call [facility].  R:  Pt receptive.

## 2021-02-18 ENCOUNTER — Ambulatory Visit: Payer: BC Managed Care – PPO | Admitting: Psychiatry

## 2021-03-19 ENCOUNTER — Other Ambulatory Visit: Payer: Self-pay | Admitting: Physician Assistant

## 2021-03-19 ENCOUNTER — Telehealth: Payer: Self-pay

## 2021-03-19 ENCOUNTER — Ambulatory Visit: Payer: BC Managed Care – PPO | Admitting: Psychiatry

## 2021-03-19 DIAGNOSIS — L538 Other specified erythematous conditions: Secondary | ICD-10-CM

## 2021-03-19 NOTE — Telephone Encounter (Signed)
Pt informed that referral was placed for dermatology

## 2021-03-20 ENCOUNTER — Other Ambulatory Visit: Payer: Self-pay

## 2021-03-20 ENCOUNTER — Ambulatory Visit: Payer: BC Managed Care – PPO | Admitting: Dermatology

## 2021-03-20 DIAGNOSIS — L732 Hidradenitis suppurativa: Secondary | ICD-10-CM | POA: Diagnosis not present

## 2021-03-20 DIAGNOSIS — L501 Idiopathic urticaria: Secondary | ICD-10-CM

## 2021-03-20 DIAGNOSIS — D2272 Melanocytic nevi of left lower limb, including hip: Secondary | ICD-10-CM

## 2021-03-20 DIAGNOSIS — D485 Neoplasm of uncertain behavior of skin: Secondary | ICD-10-CM

## 2021-03-20 DIAGNOSIS — D224 Melanocytic nevi of scalp and neck: Secondary | ICD-10-CM

## 2021-03-20 DIAGNOSIS — D229 Melanocytic nevi, unspecified: Secondary | ICD-10-CM

## 2021-03-20 NOTE — Progress Notes (Signed)
New Patient Visit  Subjective  Jordan Riley is a 21 y.o. female who presents for the following: Rash  (Hands, arms, feet, legs, chest - started in November and has happened about 3 times per week. Rash not present today but she does have photos. Has used OTC HC cream but it doesn't help. Patient states that rash doesn't itch, but it's just there. Usually appears first thing in the morning, fades by the afternoon, and typically lasts about 24 hours. Patient thought it may be due to stress from a recent breakup but it has continued to happen. ). Patient had labs in June and November 2022 which were WNL. No new or changing medications, lotions, soaps, or laundry detergent. Patient does have pets in the home. The patient has spots, moles and lesions to be evaluated, some may be new or changing and the patient has concerns that these could be cancer.  The following portions of the chart were reviewed this encounter and updated as appropriate:   Tobacco   Allergies   Meds   Problems   Med Hx   Surg Hx   Fam Hx      Review of Systems:  No other skin or systemic complaints except as noted in HPI or Assessment and Plan.  Objective  Well appearing patient in no apparent distress; mood and affect are within normal limits.  A focused examination was performed including the face and arms. Relevant physical exam findings are noted in the Assessment and Plan.  Trunk, extremities Clear today.  L med knee 0.7 cm irregular brown flat papule.     Scalp 0.4 cm regular brown macule.       Assessment & Plan  Chronic Idiopathic urticaria Trunk, extremities Chronic and persistent - reviewed photos today which were consistent with urticaria. Reviewed labs from 06/22 and 11/22 which were WNL. Restart Doxepin 10mg  po QHS (pt has) take at 8 pm since patient typically goes to sleep around 10 pm.   Continue Zyrtec po QAM and  start Allegra 180mg  po QAM. Consider increasing Allegra to 2 po QAM if not  improving at follow up.   Patient to call and report condition in one week.  Hives are generally idiopathic; possibly related to foods, sun, heat, cold or viruses. Info given to pt.  Hidradenitis suppurativa Trunk, extremities  Hidradenitis Suppurativa is a chronic; persistent; non-curable, but treatable condition due to abnormal inflamed sweat glands in the body folds (axilla, inframammary, groin, medial thighs), causing recurrent painful draining cysts and scarring. It can be associated with severe scarring acne and cysts; also abscesses and scarring of scalp. The goal is control and prevention of flares, as it is not curable. Scars are permanent and can be thickened. Treatment may include daily use of topical medication and oral antibiotics.  Oral isotretinoin may also be helpful.  For more severe cases, Humira (a biologic injection) may be prescribed to decrease the inflammatory process and prevent flares.  When indicated, inflamed cysts may also be treated surgically.  Continue Clindamycin gel QD and antibacterial wash as prescribed by PCP.  Related Medications clindamycin (CLINDAGEL) 1 % gel Apply topically 2 (two) times daily.  Neoplasm of uncertain behavior of skin L med knee Epidermal / dermal shaving  Lesion diameter (cm):  0.7 Informed consent: discussed and consent obtained   Timeout: patient name, date of birth, surgical site, and procedure verified   Procedure prep:  Patient was prepped and draped in usual sterile fashion Prep type:  Isopropyl  alcohol Anesthesia: the lesion was anesthetized in a standard fashion   Anesthetic:  1% lidocaine w/ epinephrine 1-100,000 buffered w/ 8.4% NaHCO3 Instrument used: flexible razor blade   Hemostasis achieved with: pressure, aluminum chloride and electrodesiccation   Outcome: patient tolerated procedure well   Post-procedure details: sterile dressing applied and wound care instructions given   Dressing type: bandage and petrolatum     Specimen 1 - Surgical pathology Differential Diagnosis: D48.5 r/o dysplastic nevus Check Margins: No  Nevus Scalp See photos Benign-appearing.  Observation.  Call clinic for new or changing moles.  Recommend daily use of broad spectrum spf 30+ sunscreen to sun-exposed areas.    Return in about 2 months (around 05/18/2021) for urticaria follow up .  Luther Redo, CMA, am acting as scribe for Sarina Ser, MD . Documentation: I have reviewed the above documentation for accuracy and completeness, and I agree with the above.  Sarina Ser, MD

## 2021-03-20 NOTE — Patient Instructions (Addendum)
If You Need Anything After Your Visit ° °If you have any questions or concerns for your doctor, please call our main line at [phone] and press option 4 to reach your doctor's medical assistant. If no one answers, please leave a voicemail as directed and we will return your call as soon as possible. Messages left after 4 pm will be answered the following business day.  ° °You may also send us a message via MyChart. We typically respond to MyChart messages within 1-2 business days. ° °For prescription refills, please ask your pharmacy to contact our office. Our fax number is [phone]. ° °If you have an urgent issue when the clinic is closed that cannot wait until the next business day, you can page your doctor at the number below.   ° °Please note that while we do our best to be available for urgent issues outside of office hours, we are not available 24/7.  ° °If you have an urgent issue and are unable to reach us, you may choose to seek medical care at your doctor's office, retail clinic, urgent care center, or emergency room. ° °If you have a medical emergency, please immediately call 911 or go to the emergency department. ° °Pager Numbers ° °- Dr. Kowalski: [phone] ° °- Dr. Moye: [phone] ° °- Dr. Stewart: [phone] ° °In the event of inclement weather, please call our main line at [phone] for an update on the status of any delays or closures. ° °Dermatology Medication Tips: °Please keep the boxes that topical medications come in in order to help keep track of the instructions about where and how to use these. Pharmacies typically print the medication instructions only on the boxes and not directly on the medication tubes.  ° °If your medication is too expensive, please contact our office at [phone] option 4 or send us a message through MyChart.  ° °We are unable to tell what your co-pay for medications will be in advance as this is different depending on your insurance coverage.  However, we may be able to find a substitute medication at lower cost or fill out paperwork to get insurance to cover a needed medication.  ° °If a prior authorization is required to get your medication covered by your insurance company, please allow us 1-2 business days to complete this process. ° °Drug prices often vary depending on where the prescription is filled and some pharmacies may offer cheaper prices. ° °The website www.goodrx.com contains coupons for medications through different pharmacies. The prices here do not account for what the cost may be with help from insurance (it may be cheaper with your insurance), but the website can give you the price if you did not use any insurance.  °- You can print the associated coupon and take it with your prescription to the pharmacy.  °- You may also stop by our office during regular business hours and pick up a GoodRx coupon card.  °- If you need your prescription sent electronically to a different pharmacy, notify our office through [facility] MyChart or by phone at [phone] option 4. ° ° ° ° °Si Usted Necesita Algo Después de Su Visita ° °También puede enviarnos un mensaje a través de MyChart. Por lo general respondemos a los mensajes de MyChart en el transcurso de 1 a 2 días hábiles. ° °Para renovar recetas, por favor pida a su farmacia que se ponga en contacto con nuestra oficina. Nuestro número de fax es el [phone]. ° °Si tiene   un asunto urgente cuando la clnica est cerrada y que no puede esperar hasta el siguiente da hbil, puede llamar/localizar a su doctor(a) al nmero que aparece a continuacin.   Por favor, tenga en cuenta que aunque hacemos todo lo posible para estar disponibles para asuntos urgentes fuera del horario de [place], no estamos disponibles las 24 horas del da, los 7 das de la [place].   Si tiene un problema urgente y no puede comunicarse con nosotros, puede optar por buscar atencin mdica  en el consultorio de su  doctor(a), en una clnica privada, en un centro de atencin urgente o en una sala de emergencias.  Si tiene Engineering geologist, por favor llame inmediatamente al 911 o vaya a la sala de emergencias.  Nmeros de bper  - Dr. Nehemiah Massed: [phone]  - Dra. Moye: [phone]  - Dra. Nicole Kindred: [phone]  En caso de inclemencias del [place], por favor llame a Johnsie Kindred principal al [phone] para una actualizacin sobre el [place] de cualquier retraso o cierre.  Consejos para la medicacin en dermatologa: Por favor, guarde las cajas en las que vienen los medicamentos de uso tpico para ayudarle a seguir las instrucciones sobre dnde y cmo usarlos. Las farmacias generalmente imprimen las instrucciones del medicamento slo en las cajas y no directamente en los tubos del [place].   Si su medicamento es muy caro, por favor, pngase en contacto con Zigmund Daniel llamando al [phone] y presione la opcin 4 o envenos un mensaje a travs de Pharmacist, community.   No podemos decirle cul ser su copago por los medicamentos por adelantado ya que esto es diferente dependiendo de la cobertura de su seguro. Sin embargo, es posible que podamos encontrar un medicamento sustituto a Electrical engineer un formulario para que el seguro cubra el medicamento que se considera necesario.   Si se requiere una autorizacin previa para que su compaa de seguros [place] su medicamento, por favor permtanos de 1 a 2 das hbiles para completar este proceso.  Los precios de los medicamentos varan con frecuencia dependiendo del Environmental consultant de dnde se surte la receta y alguna farmacias pueden ofrecer precios ms baratos.  El sitio web www.goodrx.com tiene cupones para medicamentos de Airline pilot. Los precios aqu no tienen en cuenta lo que podra costar con la ayuda del seguro (puede ser ms barato con su seguro), pero el sitio web puede darle el precio si no utiliz Research scientist (physical sciences).  - Puede imprimir el cupn  correspondiente y llevarlo con su receta a la farmacia.  - Tambin puede pasar por nuestra oficina durante el horario de atencin regular y Charity fundraiser una tarjeta de cupones de GoodRx.  - Si necesita que su receta se enve electrnicamente a una farmacia diferente, informe a nuestra oficina a travs de MyChart de [place] o por telfono llamando al [phone] y presione la opcin 4.   Wound Care Instructions  Cleanse wound gently with soap and water once a day then pat dry with clean gauze. Apply a thing coat of Petrolatum (petroleum jelly, "Vaseline") over the wound (unless you have an allergy to this). We recommend that you use a new, sterile tube of Vaseline. Do not pick or remove scabs. Do not remove the yellow or white "healing tissue" from the base of the wound.  Cover the wound with fresh, clean, nonstick gauze and secure with paper tape. You may use Band-Aids in place of gauze and tape if the would is small enough, but would recommend trimming much of  the tape off as there is often too much. Sometimes Band-Aids can irritate the skin.  You should call the office for your biopsy report after 1 week if you have not already been contacted.  If you experience any problems, such as abnormal amounts of bleeding, swelling, significant bruising, significant pain, or evidence of infection, please call the office immediately.  FOR ADULT SURGERY PATIENTS: If you need something for pain relief you may take 1 extra strength Tylenol (acetaminophen) AND 2 Ibuprofen (200mg  each) together every 4 hours as needed for pain. (do not take these if you are allergic to them or if you have a reason you should not take them.) Typically, you may only need pain medication for 1 to 3 days.   Hives Hives (urticaria) are itchy, red, swollen areas on the skin. Hives can appear on any part of the body. Hives often fade within 24 hours (acute hives). Sometimes, new hives appear after old ones fade and the cycle can  continue for several days or weeks (chronic hives). Hives do not spread from person to person (are not contagious). Hives come from the body's reaction to something a person is allergic to (allergen), something that causes irritation, or various other triggers. When a person is exposed to a trigger, his or her body releases a chemical (histamine) that causes redness, itching, and swelling. Hives can appear right after exposure to a trigger or hours later. What are the causes? This condition may be caused by: Allergies to foods or ingredients. Insect bites or stings. Exposure to pollen or pets. Spending time in sunlight, heat, or cold (exposure). Exercise. Stress. You can also get hives from other medical conditions and treatments, such as: Viruses, including the common cold. Bacterial infections, such as urinary tract infections and strep throat. Certain medicines. Contact with latex or chemicals. Allergy shots. Blood transfusions. Sometimes, the cause of this condition is not known (idiopathic hives). What increases the risk? You are more likely to develop this condition if you: Are a woman. Have food allergies, especially to citrus fruits, milk, eggs, peanuts, tree nuts, or shellfish. Are allergic to: Medicines. Latex. Insects. Animals. Pollen. What are the signs or symptoms? Common symptoms of this condition include raised, itchy, red or white bumps or patches on your skin. These areas may: Become large and swollen (welts). Change in shape and location, quickly and repeatedly. Be separate hives or connect over a large area of skin. Sting or become painful. Turn white when pressed in the center (blanch). In severe cases, your hands, feet, and face may also become swollen. This may occur if hives develop deeper in your skin. How is this diagnosed? This condition may be diagnosed by your symptoms, medical history, and physical exam. Your skin, urine, or blood may be tested to  find out what is causing your hives and to rule out other health issues. Your health care provider may also remove a small sample of skin from the affected area and examine it under a microscope (biopsy). How is this treated? Treatment for this condition depends on the cause and severity of your symptoms. Your health care provider may recommend using cool, wet cloths (cool compresses) or taking cool showers to relieve itching. Treatment may include: Medicines that help: Relieve itching (antihistamines). Reduce swelling (corticosteroids). Treat infection (antibiotics). An injectable medicine (omalizumab). Your health care provider may prescribe this if you have chronic idiopathic hives and you continue to have symptoms even after treatment with antihistamines. Severe cases may require an  emergency injection of adrenaline (epinephrine) to prevent a life-threatening allergic reaction (anaphylaxis). Follow these instructions at home: Medicines Take and apply over-the-counter and prescription medicines only as told by your health care provider. If you were prescribed an antibiotic medicine, take it as told by your health care provider. Do not stop using the antibiotic even if you start to feel better. Skin care Apply cool compresses to the affected areas. Do not scratch or rub your skin. General instructions Do not take hot showers or baths. This can make itching worse. Do not wear tight-fitting clothing. Use sunscreen and wear protective clothing when you are outside. Avoid any substances that cause your hives. Keep a journal to help track what causes your hives. Write down: What medicines you take. What you eat and drink. What products you use on your skin. Keep all follow-up visits as told by your health care provider. This is important. Contact a health care provider if: Your symptoms are not controlled with medicine. Your joints are painful or swollen. Get help right away if: You have a  fever. You have pain in your abdomen. Your tongue or lips are swollen. Your eyelids are swollen. Your chest or throat feels tight. You have trouble breathing or swallowing. These symptoms may represent a serious problem that is an emergency. Do not wait to see if the symptoms will go away. Get medical help right away. Call your local emergency services (911 in the [place] Do not drive yourself to the hospital. Summary Hives (urticaria) are itchy, red, swollen areas on your skin. Hives come from the body's reaction to something a person is allergic to (allergen), something that causes irritation, or various other triggers. Treatment for this condition depends on the cause and severity of your symptoms. Avoid any substances that cause your hives. Keep a journal to help track what causes your hives. Take and apply over-the-counter and prescription medicines only as told by your health care provider. Get help right away if your chest or throat feels tight or if you have trouble breathing or swallowing. This information is not intended to replace advice given to you by your health care provider. Make sure you discuss any questions you have with your health care provider. Document Revised: 04/22/2020 Document Reviewed: 04/22/2020 Elsevier Patient Education  [place].

## 2021-03-21 ENCOUNTER — Encounter: Payer: Self-pay | Admitting: Physician Assistant

## 2021-03-21 ENCOUNTER — Encounter: Payer: Self-pay | Admitting: Dermatology

## 2021-03-21 ENCOUNTER — Telehealth (INDEPENDENT_AMBULATORY_CARE_PROVIDER_SITE_OTHER): Payer: BC Managed Care – PPO | Admitting: Physician Assistant

## 2021-03-21 VITALS — Temp 98.0°F

## 2021-03-21 DIAGNOSIS — J069 Acute upper respiratory infection, unspecified: Secondary | ICD-10-CM | POA: Diagnosis not present

## 2021-03-21 MED ORDER — AZITHROMYCIN 250 MG PO TABS: ORAL_TABLET | ORAL | 0 refills | Status: DC

## 2021-03-21 MED ORDER — BENZONATATE 100 MG PO CAPS: 100.0000 mg | ORAL_CAPSULE | Freq: Two times a day (BID) | ORAL | 0 refills | Status: DC | PRN

## 2021-03-21 NOTE — Progress Notes (Signed)
[facility] [place], [place] [address]  Internal MEDICINE  Telephone Visit  Patient Name: Jordan Riley  [id number]  [id number]  Date of Service: 03/21/2021  I connected with the patient at 10:08 by telephone and verified the patients identity using two identifiers.   I discussed the limitations, risks, security and privacy concerns of performing an evaluation and management service by telephone and the availability of in person appointments. I also discussed with the patient that there may be a patient responsible charge related to the service.  The patient expressed understanding and agrees to proceed.    Chief Complaint  Patient presents with   Telephone Screen   Telephone Assessment   Nasal Congestion    Has been coughing up yellow mucus, sore throat, has lasted about one week - NEG Covid    HPI Pt is here for virtual sick visit -Started feeling sick Saturday with congestion, but now is worse with thick yellow mucus production, sore throat, headaches, does have some wheezing and SOB with exertion at times. -Denies fever, chills and covid test is negative -Has tried alka seltzer day and night cough and congestion and helped at night, but still coughing during daytime. Tried robitussin but made her feel worse. -using her nasal spray  Current Medication: Outpatient Encounter Medications as of 03/21/2021  Medication Sig   azithromycin (ZITHROMAX) 250 MG tablet Take one tab a day for 10 days for uri   benzonatate (TESSALON) 100 MG capsule Take 1 capsule (100 mg total) by mouth 2 (two) times daily as needed for cough.   clindamycin (CLINDAGEL) 1 % gel Apply topically 2 (two) times daily.   doxepin (SINEQUAN) 10 MG capsule Take 10 mg by mouth.   escitalopram (LEXAPRO) 10 MG tablet Take 1 tablet (10 mg total) by mouth daily.   fluticasone (FLONASE) 50 MCG/ACT nasal spray Place into the nose.   propranolol (INDERAL) 10 MG tablet Take 1 tablet (10 mg total) by  mouth 2 (two) times daily as needed. For severe anxiety attacks only   No facility-administered encounter medications on file as of 03/21/2021.    Surgical History: Past Surgical History:  Procedure Laterality Date   TONSILLECTOMY AND ADENOIDECTOMY      Medical History: Past Medical History:  Diagnosis Date   Anxiety    Depression     Family History: Family History  Problem Relation Age of Onset   Bipolar disorder Mother    Heart disease Mother    Hypothyroidism Mother    Mental illness Sister    Mental illness Maternal Grandfather    Mental illness Maternal Grandmother     Social History   Socioeconomic History   Marital status: Single    Spouse name: Not on file   Number of children: Not on file   Years of education: Not on file   Highest education level: Not on file  Occupational History   Not on file  Tobacco Use   Smoking status: Former    Types: E-cigarettes   Smokeless tobacco: Never  Vaping Use   Vaping Use: Former   Substances: Nicotine  Substance and Sexual Activity   Alcohol use: Never   Drug use: Never   Sexual activity: Yes  Other Topics Concern   Not on file  Social History Narrative   Currently doing associates and employed full time   Social Determinants of Radio broadcast assistant Strain: Not on file  Food Insecurity: Not on file  Transportation Needs: Not  on file  Physical Activity: Not on file  Stress: Not on file  Social Connections: Not on file  Intimate Partner Violence: Not on file      Review of Systems  Constitutional:  Positive for fatigue. Negative for fever.  HENT:  Positive for congestion, postnasal drip, sinus pressure and sore throat. Negative for mouth sores.   Respiratory:  Positive for cough, shortness of breath and wheezing.   Cardiovascular:  Negative for chest pain.  Genitourinary:  Negative for flank pain.  Neurological:  Positive for headaches.  Psychiatric/Behavioral: Negative.     Vital Signs: Temp  98 F (36.7 C)    Observation/Objective:  Pt is here for routine follow up   Assessment/Plan: 1. Upper respiratory tract infection, unspecified type Will start on zpak and may take tessalon as needed for cough. Also advised to start mucinex and continue flonase and stay well hydrated and rest. Pt will call if not improving or any worsening. - azithromycin (ZITHROMAX) 250 MG tablet; Take one tab a day for 10 days for uri  Dispense: 10 tablet; Refill: 0 - benzonatate (TESSALON) 100 MG capsule; Take 1 capsule (100 mg total) by mouth 2 (two) times daily as needed for cough.  Dispense: 20 capsule; Refill: 0   General Counseling: [name] verbalizes understanding of the findings of today's phone visit and agrees with plan of treatment. I have discussed any further diagnostic evaluation that may be needed or ordered today. We also reviewed her medications today. she has been encouraged to call the office with any questions or concerns that should arise related to todays visit.    No orders of the defined types were placed in this encounter.   Meds ordered this encounter  Medications   azithromycin (ZITHROMAX) 250 MG tablet    Sig: Take one tab a day for 10 days for uri    Dispense:  10 tablet    Refill:  0   benzonatate (TESSALON) 100 MG capsule    Sig: Take 1 capsule (100 mg total) by mouth 2 (two) times daily as needed for cough.    Dispense:  20 capsule    Refill:  0    Time spent:25 Minutes    Dr Lavera Guise Internal medicine

## 2021-03-26 ENCOUNTER — Telehealth: Payer: Self-pay

## 2021-03-26 ENCOUNTER — Other Ambulatory Visit: Payer: Self-pay | Admitting: Psychiatry

## 2021-03-26 DIAGNOSIS — F411 Generalized anxiety disorder: Secondary | ICD-10-CM

## 2021-03-26 DIAGNOSIS — F39 Unspecified mood [affective] disorder: Secondary | ICD-10-CM

## 2021-03-26 NOTE — Telephone Encounter (Signed)
-----   Message from Ralene Bathe, MD sent at 03/25/2021  6:00 PM EST ----- Diagnosis Skin , left med knee PIGMENTED SPINDLE CELL NEVUS, CLOSE TO MARGIN  Benign mole But some irregular cells Margins free, but "close to margin" May need additional procedure Recheck next visit Keep March 2023 appt

## 2021-03-26 NOTE — Telephone Encounter (Signed)
Patient informed of pathology results 

## 2021-04-01 ENCOUNTER — Ambulatory Visit: Payer: BC Managed Care – PPO | Admitting: Psychiatry

## 2021-04-08 ENCOUNTER — Other Ambulatory Visit: Payer: Self-pay

## 2021-04-08 ENCOUNTER — Encounter: Payer: Self-pay | Admitting: Physician Assistant

## 2021-04-08 ENCOUNTER — Ambulatory Visit: Payer: BC Managed Care – PPO | Admitting: Physician Assistant

## 2021-04-08 DIAGNOSIS — R7989 Other specified abnormal findings of blood chemistry: Secondary | ICD-10-CM

## 2021-04-08 DIAGNOSIS — L501 Idiopathic urticaria: Secondary | ICD-10-CM

## 2021-04-08 DIAGNOSIS — F411 Generalized anxiety disorder: Secondary | ICD-10-CM | POA: Diagnosis not present

## 2021-04-08 DIAGNOSIS — F339 Major depressive disorder, recurrent, unspecified: Secondary | ICD-10-CM

## 2021-04-08 NOTE — Progress Notes (Signed)
[facility] [place], [place] [address]  Internal MEDICINE  Office Visit Note  Patient Name: Jordan Riley  [id number]  [id number]  Date of Service: 04/14/2021  Chief Complaint  Patient presents with   Follow-up   Depression   Anxiety    HPI Pt is here for routine follow up -Went to dermatology and told her rash is chronic hives, also had a mole removed with atypical cells and will follow up again in March. Rash greatly improved from original flare -On 10mg  lexapro and is helping with anxiety, still has some highs and lows, Followed by psych. Due to see Dr. Shea Evans again soon -Will have thyroid rechecked -Graduated with Associate's degree, has been hiking every weekend -Sleep is ok, gets interrupted by cats more so than anything -Taking vitamins b12, zinc, iron, mg, vit C, collagen -Sometimes will take hydroxyzine  Current Medication: Outpatient Encounter Medications as of 04/08/2021  Medication Sig   benzonatate (TESSALON) 100 MG capsule Take 1 capsule (100 mg total) by mouth 2 (two) times daily as needed for cough.   clindamycin (CLINDAGEL) 1 % gel Apply topically 2 (two) times daily.   doxepin (SINEQUAN) 10 MG capsule Take 10 mg by mouth.   escitalopram (LEXAPRO) 10 MG tablet Take 1 tablet (10 mg total) by mouth daily.   fluticasone (FLONASE) 50 MCG/ACT nasal spray Place into the nose.   propranolol (INDERAL) 10 MG tablet Take 1 tablet (10 mg total) by mouth 2 (two) times daily as needed. For severe anxiety attacks only   azithromycin (ZITHROMAX) 250 MG tablet Take one tab a day for 10 days for uri (Patient not taking: Reported on 04/08/2021)   [DISCONTINUED] escitalopram (LEXAPRO) 5 MG tablet Take 2 tablets (10 mg total) by mouth daily.   [DISCONTINUED] hydrOXYzine (VISTARIL) 25 MG capsule Take 1 capsule (25 mg total) by mouth every 8 (eight) hours as needed.   No facility-administered encounter medications on file as of 04/08/2021.    Surgical  History: Past Surgical History:  Procedure Laterality Date   TONSILLECTOMY AND ADENOIDECTOMY      Medical History: 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   Depression     Family History: Family History  Problem Relation Age of Onset   Bipolar disorder Mother    Heart disease Mother    Hypothyroidism Mother    Mental illness Sister    Mental illness Maternal Grandfather    Mental illness Maternal Grandmother     Social History   Socioeconomic History   Marital status: Single    Spouse name: Not on file   Number of children: Not on file   Years of education: Not on file   Highest education level: Not on file  Occupational History   Not on file  Tobacco Use   Smoking status: Former    Types: E-cigarettes   Smokeless tobacco: Never  Vaping Use   Vaping Use: Former   Substances: Nicotine  Substance and Sexual Activity   Alcohol use: Never   Drug use: Never   Sexual activity: Yes  Other Topics Concern   Not on file  Social History Narrative   Currently doing associates and employed full time   Social Determinants of Radio broadcast assistant Strain: Not on file  Food Insecurity: Not on file  Transportation Needs: Not on file  Physical Activity: Not on file  Stress: Not  on file  Social Connections: Not on file  Intimate Partner Violence: Not on file      Review of Systems  Constitutional:  Positive for fatigue. Negative for chills and unexpected weight change.  HENT:  Negative for congestion, postnasal drip, rhinorrhea, sneezing and sore throat.   Eyes:  Negative for redness.  Respiratory:  Negative for cough, chest tightness and shortness of breath.   Cardiovascular:  Negative for chest pain and palpitations.  Gastrointestinal:  Negative for abdominal pain, constipation, diarrhea, nausea and vomiting.  Genitourinary:  Negative for dysuria and frequency.  Musculoskeletal:  Negative for  arthralgias, back pain, joint swelling and neck pain.  Skin:  Positive for rash.  Neurological: Negative.  Negative for tremors and numbness.  Hematological:  Negative for adenopathy. Does not bruise/bleed easily.  Psychiatric/Behavioral:  Positive for behavioral problems (Depression). Negative for sleep disturbance and suicidal ideas. The patient is nervous/anxious.    Vital Signs: BP 113/69    Pulse 72    Temp 98.4 F (36.9 C)    Resp 16    Ht 5\' 6"  (1.676 m)    Wt 222 lb 6.4 oz (100.9 kg)    SpO2 97%    BMI 35.90 kg/m    Physical Exam Vitals and nursing note reviewed.  Constitutional:      General: She is not in acute distress.    Appearance: She is well-developed. She is obese. She is not diaphoretic.  HENT:     Head: Normocephalic and atraumatic.     Mouth/Throat:     Pharynx: No oropharyngeal exudate.  Eyes:     Pupils: Pupils are equal, round, and reactive to light.  Neck:     Thyroid: No thyromegaly.     Vascular: No JVD.     Trachea: No tracheal deviation.  Cardiovascular:     Rate and Rhythm: Normal rate and regular rhythm.     Heart sounds: Normal heart sounds. No murmur heard.   No friction rub. No gallop.  Pulmonary:     Effort: Pulmonary effort is normal. No respiratory distress.     Breath sounds: No wheezing or rales.  Chest:     Chest wall: No tenderness.  Abdominal:     General: Bowel sounds are normal.     Palpations: Abdomen is soft.  Musculoskeletal:        General: Normal range of motion.     Cervical back: Normal range of motion and neck supple.  Lymphadenopathy:     Cervical: No cervical adenopathy.  Skin:    General: Skin is warm and dry.  Neurological:     Mental Status: She is alert and oriented to person, place, and time.     Cranial Nerves: No cranial nerve deficit.  Psychiatric:        Behavior: Behavior normal.        Thought Content: Thought content normal.        Judgment: Judgment normal.       Assessment/Plan: 1.  Depression, recurrent ([place]) Followed by psych  2. GAD (generalized anxiety disorder) Followed by psych  3. Abnormal thyroid blood test Will have updated labs, if still abnormal may need thyroid US  4. 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 I have discussed any further diagnostic evaluation that may be needed or ordered today. We also reviewed her medications today. she has been encouraged to call the  office with any questions or concerns that should arise related to todays visit.    No orders of the defined types were placed in this encounter.   No orders of the defined types were placed in this encounter.   This patient was seen by Drema Dallas, PA-C in collaboration with Dr. Clayborn Bigness as a part of collaborative care agreement.   Total time spent:30 Minutes Time spent includes review of chart, medications, test results, and follow up plan with the patient.      Dr Lavera Guise Internal medicine

## 2021-04-24 ENCOUNTER — Encounter: Payer: Self-pay | Admitting: Psychiatry

## 2021-04-24 ENCOUNTER — Ambulatory Visit (INDEPENDENT_AMBULATORY_CARE_PROVIDER_SITE_OTHER): Payer: BC Managed Care – PPO | Admitting: Psychiatry

## 2021-04-24 ENCOUNTER — Other Ambulatory Visit: Payer: Self-pay

## 2021-04-24 VITALS — BP 135/92 | HR 72 | Temp 98.3°F | Wt 220.2 lb

## 2021-04-24 DIAGNOSIS — F411 Generalized anxiety disorder: Secondary | ICD-10-CM

## 2021-04-24 DIAGNOSIS — Z630 Problems in relationship with spouse or partner: Secondary | ICD-10-CM

## 2021-04-24 DIAGNOSIS — F39 Unspecified mood [affective] disorder: Secondary | ICD-10-CM | POA: Diagnosis not present

## 2021-04-24 NOTE — Progress Notes (Signed)
[facility] MD OP Progress Note  04/24/2021 10:48 AM Jordan Riley  MRN:  [id number]  Chief Complaint:  Chief Complaint   Follow-up 21 year old Caucasian female with history of anxiety, episodic mood disorder, presented for medication management.    HPI: Jordan Riley is a 21 year old Caucasian female, lives in [place], has a history of GAD, episodic mood disorder, insomnia, employed, presented for follow-up.  Patient today returns for a follow-up visit reporting that she has been noncompliant with her medications.  Patient reports she developed hives when she went through an episode of stress in her relationship with her boyfriend.  Patient reports she does not know if the hives where caused by her medication like Lexapro or the hydroxyzine or from something else.  She reports she  stopped taking the Lexapro and the hydroxyzine and gradually her hives went away.  She is currently not back on any of her medications.  She continues to grieve the loss of her relationship with her boyfriend.  He moved out a couple of months ago.  She reports she continues to have episodes when she feels sad at times when she is alone in her apartment.  However overall she has been able to distract herself.  She has been spending time with her friends.  She continues to work at [employer].  She also got into [employer] for her bachelor's degree in business.  She will start this fall.  Looks forward to that.  She was able to establish care with Ms. Miguel Dibble and has upcoming appointments coming up.  Denies any suicidality, homicidality or perceptual disturbances.  Reports sleep is good.  Denies any other concerns today.  Visit Diagnosis:    ICD-10-CM   1. GAD (generalized anxiety disorder)  F41.1     2. Episodic mood disorder (HCC)  F39     3. Partner relational problem  Z63.0       Past Psychiatric History: Reviewed past psychiatric history from progress note on 12/18/2018.  Past trials of Zoloft, Lexapro,  hydroxyzine.  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  Family History:  Family History  Problem Relation Age of Onset   Bipolar disorder Mother    Heart disease Mother    Hypothyroidism Mother    Mental illness Sister    Mental illness Maternal Grandfather    Mental illness Maternal Grandmother     Social History: Reviewed social history from progress note on 12/18/2018. Social History   Socioeconomic History   Marital status: Single    Spouse name: Not on file   Number of children: Not on file   Years of education: Not on file   Highest education level: Not on file  Occupational History   Not on file  Tobacco Use   Smoking status: Former    Types: E-cigarettes   Smokeless tobacco: Never  Vaping Use   Vaping Use: Former   Substances: Nicotine  Substance and Sexual Activity   Alcohol use: Never   Drug use: Never   Sexual activity: Yes  Other Topics Concern   Not on file  Social History Narrative   Currently doing associates and employed full time   Social Determinants of  Health   Financial Resource Strain: Not on file  Food Insecurity: Not on file  Transportation Needs: Not on file  Physical Activity: Not on file  Stress: Not on file  Social Connections: Not on file    Allergies:  Allergies  Allergen Reactions   Shellfish Allergy Anaphylaxis   Cefdinir Nausea And Vomiting    Metabolic Disorder Labs: No results found for: HGBA1C, MPG No results found for: PROLACTIN Lab Results  Component Value Date   CHOL 188 (H) 09/22/2019   TRIG 107 (H) 09/22/2019   HDL 51 09/22/2019   LDLCALC 118 (H) 09/22/2019   Lab Results  Component Value Date   TSH 1.850  01/29/2021   TSH 3.880 09/22/2019    Therapeutic Level Labs: No results found for: LITHIUM No results found for: VALPROATE No components found for:  CBMZ  Current Medications: Current Outpatient Medications  Medication Sig Dispense Refill   fluticasone (FLONASE) 50 MCG/ACT nasal spray Place into the nose.     propranolol (INDERAL) 10 MG tablet Take 10 mg by mouth 2 (two) times daily as needed.     clindamycin (CLINDAGEL) 1 % gel Apply topically 2 (two) times daily. 60 g 1   No current facility-administered medications for this visit.     Musculoskeletal: Strength & Muscle Tone: within normal limits Gait & Station: normal Patient leans: N/A  Psychiatric Specialty Exam: Review of Systems  Psychiatric/Behavioral:         Sad at times due to break-up.  All other systems reviewed and are negative.  Blood pressure (!) 135/92, pulse 72, temperature 98.3 F (36.8 C), temperature source Temporal, weight 220 lb 3.2 oz (99.9 kg), last menstrual period 04/04/2021.Body mass index is 35.54 kg/m.  General Appearance: Casual  Eye Contact:  Fair  Speech:  Clear and Coherent  Volume:  Normal  Mood:   sad at times   Affect:  Full Range  Thought Process:  Goal Directed and Descriptions of Associations: Intact  Orientation:  Full (Time, Place, and Person)  Thought Content: Logical   Suicidal Thoughts:  No  Homicidal Thoughts:  No  Memory:  Immediate;   Fair Recent;   Fair Remote;   Fair  Judgement:  Fair  Insight:  Fair  Psychomotor Activity:  Normal  Concentration:  Concentration: Fair and Attention Span: Fair  Recall:  [employer] of Knowledge: Fair  Language: Fair  Akathisia:  No  Handed:  Right  AIMS (if indicated): not done  Assets:  Communication Skills Desire for Improvement Housing Social Support  ADL's:  Intact  Cognition: WNL  Sleep:  Fair   Screenings: GAD-7    [place] Office Visit from 04/24/2021 in [facility] Video Visit  from 12/17/2020 in [facility]  Total GAD-7 Score 2 18      PHQ2-9    [place] Visit from 04/24/2021 in [facility] Most recent reading at 04/24/2021 10:36 AM Office Visit from 02/04/2021 in [facility] Most recent reading at 02/04/2021  3:34 PM Office Visit from 02/04/2021 in [facility] Most recent reading at 02/04/2021  9:33 AM Video Visit from 12/17/2020 in [facility] Most recent reading at 12/17/2020  1:31 PM Office Visit from 11/05/2020 in [facility] Most recent reading at 11/05/2020  3:44 PM  PHQ-2 Total Score 1 3 2 1  0  PHQ-9 Total Score -- 12 -- 10 --      Flowsheet  Row Office Visit from 02/04/2021 in [facility] Video Visit from 12/17/2020 in [facility] from 08/29/2020 in [facility] CATEGORY Error: Q3, 4, or 5 should not be populated when Q2 is No No Risk No Risk        Assessment and Plan: Jordan Riley is a 21 year old Caucasian female who lives in [place], employed, has a history of anxiety, episodic mood disorder, presents for medication management.  Patient with noncompliance with medications, possible adverse side effects to her medications, will benefit from the following plan.  Plan GAD-improving Discontinue Lexapro-unknown if this is causing her hives. Continue propranolol 10 mg p.o. twice daily as needed for severe anxiety attacks.  She has not been using it and agrees to use it if she has any significant anxiety attacks. Patient has established care with Ms. Miguel Dibble, advised to continue CBT  Episodic mood disorder-improving Continue CBT Continue propranolol 10 mg p.o. twice daily as needed We will consider adding an SSRI in the future or a mood stabilizer as needed.   Partner  relational problem-unstable Provided supportive psychotherapy. Continue CBT with Ms. Miguel Dibble   Follow-up in clinic in 3 months or sooner if needed.  This note was generated in part or whole with voice recognition software. Voice recognition is usually quite accurate but there are transcription errors that can and very often do occur. I apologize for any typographical errors that were not detected and corrected.       Ursula Alert, MD 04/25/2021, 8:28 AM

## 2021-04-30 ENCOUNTER — Telehealth: Payer: Self-pay

## 2021-04-30 ENCOUNTER — Other Ambulatory Visit: Payer: Self-pay

## 2021-04-30 MED ORDER — AZITHROMYCIN 250 MG PO TABS: 250.0000 mg | ORAL_TABLET | Freq: Every day | ORAL | 0 refills | Status: DC

## 2021-04-30 NOTE — Telephone Encounter (Signed)
Pt called that she is coughing yellow and green mucus and sinusitis covid test is negative advised as per dfk we send zpak and also advised her take mucinex for cough and gargle with salt water and call us back if she is not feeling better she can call us back

## 2021-06-12 ENCOUNTER — Ambulatory Visit: Payer: BC Managed Care – PPO | Admitting: Dermatology

## 2021-06-28 ENCOUNTER — Other Ambulatory Visit: Payer: BC Managed Care – PPO | Admitting: Physician Assistant

## 2021-07-17 ENCOUNTER — Ambulatory Visit: Payer: BC Managed Care – PPO | Admitting: Psychiatry

## 2021-07-17 ENCOUNTER — Ambulatory Visit: Payer: BC Managed Care – PPO | Admitting: Dermatology

## 2021-07-17 DIAGNOSIS — D229 Melanocytic nevi, unspecified: Secondary | ICD-10-CM

## 2021-07-17 DIAGNOSIS — D2272 Melanocytic nevi of left lower limb, including hip: Secondary | ICD-10-CM

## 2021-07-17 DIAGNOSIS — L732 Hidradenitis suppurativa: Secondary | ICD-10-CM | POA: Diagnosis not present

## 2021-07-17 DIAGNOSIS — L501 Idiopathic urticaria: Secondary | ICD-10-CM | POA: Diagnosis not present

## 2021-07-17 MED ORDER — CLINDAMYCIN PHOSPHATE 1 % EX GEL: Freq: Two times a day (BID) | CUTANEOUS | 6 refills | Status: DC

## 2021-07-17 MED ORDER — DOXYCYCLINE MONOHYDRATE 100 MG PO CAPS: 100.0000 mg | ORAL_CAPSULE | ORAL | 6 refills | Status: DC

## 2021-07-17 NOTE — Patient Instructions (Addendum)
Hidradenitis Suppurativa is a chronic; persistent; non-curable, but treatable condition due to abnormal inflamed sweat glands in the body folds (axilla, inframammary, groin, medial thighs), causing recurrent painful draining cysts and scarring. It can be associated with severe scarring acne and cysts; also abscesses and scarring of scalp. The goal is control and prevention of flares, as it is not curable. Scars are permanent and can be thickened. Treatment may include daily use of topical medication and oral antibiotics.  Oral isotretinoin may also be helpful.  For more severe cases, Humira (a biologic injection) may be prescribed to decrease the inflammatory process and prevent flares.  When indicated, inflamed cysts may also be treated surgically. ? ? ?Discussed new medications may be available in future such as concentyx and oxybutynin ? ?Discussed other treatments that area available now such as  accutane, doxycycline, humira , and surgical treatment to remove boil ? ?Continue Clindamycin gel daily and antibacterial wash as prescribed by PCP. ?Start doxycyline 100 capsule by mouth daily with food and drink. When flared can take 2 capsules by mouth for 7 days.  ? ?Doxycycline should be taken with food to prevent nausea. Do not lay down for 30 minutes after taking. Be cautious with sun exposure and use good sun protection while on this medication. Pregnant women should not take this medication.  ? ? ? ?If You Need Anything After Your Visit ? ?If you have any questions or concerns for your doctor, please call our main line at [phone] and press option 4 to reach your doctor's medical assistant. If no one answers, please leave a voicemail as directed and we will return your call as soon as possible. Messages left after 4 pm will be answered the following business day.  ? ?You may also send [place] a message via MyChart. We typically respond to MyChart messages within 1-2 business days. ? ?For prescription refills,  please ask your pharmacy to contact our office. Our fax number is [phone]. ? ?If you have an urgent issue when the clinic is closed that cannot wait until the next business day, you can page your doctor at the number below.   ? ?Please note that while we do our best to be available for urgent issues outside of office hours, we are not available 24/7.  ? ?If you have an urgent issue and are unable to reach [place], you may choose to seek medical care at your doctor's office, retail clinic, urgent care center, or emergency room. ? ?If you have a medical emergency, please immediately call 911 or go to the emergency department. ? ?Pager Numbers ? ?- Dr. Nehemiah Massed: [phone] ? ?- Dr. Laurence Ferrari: [phone] ? ?- Dr. Nicole Kindred: [phone] ? ?In the event of inclement weather, please call our main line at [phone] for an update on the status of any delays or closures. ? ?Dermatology Medication Tips: ?Please keep the boxes that topical medications come in in order to help keep track of the instructions about where and how to use these. Pharmacies typically print the medication instructions only on the boxes and not directly on the medication tubes.  ? ?If your medication is too expensive, please contact our office at [phone] option 4 or send [place] a message through [place].  ? ?We are unable to tell what your co-pay for medications will be in advance as this is different depending on your insurance coverage. However, we may be able to find a substitute medication at lower cost or fill out paperwork to get insurance to  cover a needed medication.  ? ?If a prior authorization is required to get your medication covered by your insurance company, please allow [place] 1-2 business days to complete this process. ? ?Drug prices often vary depending on where the prescription is filled and some pharmacies may offer cheaper prices. ? ?The website www.goodrx.com contains coupons for medications through different pharmacies. The prices  here do not account for what the cost may be with help from insurance (it may be cheaper with your insurance), but the website can give you the price if you did not use any insurance.  ?- You can print the associated coupon and take it with your prescription to the pharmacy.  ?- You may also stop by our office during regular business hours and pick up a GoodRx coupon card.  ?- If you need your prescription sent electronically to a different pharmacy, notify our office through [facility] or by phone at [phone] option 4. ? ? ? ? ?Si Usted Necesita Algo Despu?s de Su Visita ? ?Tambi?n puede enviarnos un mensaje a trav?s de MyChart. Por lo general respondemos a los mensajes de MyChart en el transcurso de 1 a 2 d?as h?biles. ? ?Para renovar recetas, por favor pida a su farmacia que se ponga en contacto con nuestra oficina. Nuestro n?mero de fax es el [phone]. ? ?Si tiene un asunto urgente cuando la cl?nica est? cerrada y que no puede esperar hasta el siguiente d?a h?bil, puede llamar/localizar a su doctor(a) al n?mero que aparece a continuaci?n.  ? ?Por favor, tenga en cuenta que aunque hacemos todo lo posible para estar disponibles para asuntos urgentes fuera del horario de oficina, no estamos disponibles las 24 horas del d?a, los 7 d?as de la semana.  ? ?Si tiene un problema urgente y no puede comunicarse con nosotros, puede optar por buscar atenci?n m?dica  en el consultorio de su doctor(a), en una cl?nica privada, en un centro de atenci?n urgente o en una sala de emergencias. ? ?Si tiene Engineer, maintenance (IT) m?dica, por favor llame inmediatamente al 911 o vaya a la sala de emergencias. ? ?N?meros de b?per ? ?- Dr. Nehemiah Massed: [phone] ? ?- Dra. Moye: [phone] ? ?- Dra. Nicole Kindred: [phone] ? ?En caso de inclemencias del tiempo, por favor llame a nuestra l?nea principal al [phone] para una actualizaci?n sobre el estado de cualquier retraso o cierre. ? ?Consejos para la medicaci?n en  dermatolog?a: ?Por favor, guarde las cajas en las que vienen los medicamentos de uso t?pico para ayudarle a seguir las instrucciones sobre d?nde y c?mo usarlos. Las farmacias generalmente imprimen las instrucciones del medicamento s?lo en las cajas y no directamente en los tubos del [place].  ? ?Si su medicamento es muy caro, por favor, p?ngase en contacto con Zigmund Daniel llamando al [phone] y presione la opci?n 4 o env?enos un mensaje a trav?s de MyChart.  ? ?No podemos decirle cu?l ser? su copago por los medicamentos por adelantado ya que esto es diferente dependiendo de la cobertura de su seguro. Sin embargo, es posible que podamos encontrar un medicamento sustituto a Electrical engineer un formulario para que el seguro cubra el medicamento que se considera necesario.  ? ?Si se requiere Ardelia Mems autorizaci?n previa para que su compa??a de seguros [place] su medicamento, por favor perm?tanos de 1 a 2 d?as h?biles para completar este proceso. ? ?Los precios de los medicamentos var?an con frecuencia dependiendo del Environmental consultant de d?nde se surte la receta y alguna farmacias pueden ofrecer precios m?s  baratos. ? ?El sitio web www.goodrx.com tiene cupones para medicamentos de Airline pilot. Los precios aqu? no tienen en cuenta lo que podr?a costar con la ayuda del seguro (puede ser m?s barato con su seguro), pero el sitio web puede darle el precio si no utiliz? ning?n seguro.  ?- Puede imprimir el cup?n correspondiente y llevarlo con su receta a la farmacia.  ?- Tambi?n puede pasar por nuestra oficina durante el horario de atenci?n regular y recoger una tarjeta de cupones de GoodRx.  ?- Si necesita que su receta se env?e electr?nicamente a Chiropodist, informe a nuestra oficina a trav?s de MyChart de [place] o por tel?fono llamando al [phone] y presione la opci?n 4. ? ?

## 2021-07-17 NOTE — Progress Notes (Signed)
? ?Follow-Up Visit ?  ?Subjective  ?Jordan Riley is a 21 y.o. female who presents for the following: Follow-up (Hx of idiopathic urticaria , patient  is clear today and reports she discovered allergy to allergy medication was causing rash./HS - patient would like refills to clindamycin and would like to discuss scarring). ? ?The following portions of the chart were reviewed this encounter and updated as appropriate:  Tobacco  Allergies  Meds  Problems  Med Hx  Surg Hx  Fam Hx   ?  ?Review of Systems: No other skin or systemic complaints except as noted in HPI or Assessment and Plan. ? ?Objective  ?Well appearing patient in no apparent distress; mood and affect are within normal limits. ? ?A focused examination was performed including left medial knee . Relevant physical exam findings are noted in the Assessment and Plan. ? ?extremities and trunk ?Clear at exam  ? ?trunk and extremities ?Clear today  ? ?left medial knee ?Bx scar ? ? ?Assessment & Plan  ?Idiopathic urticaria ?extremities and trunk ?resolved ?Apparently it was Allergic reaction from allergy medication. ? ?Patient has stopped taking and hives/rash has cleared.  ? ?Hidradenitis suppurativa ?trunk and extremities ?Hidradenitis Suppurativa is a chronic; persistent; non-curable, but treatable condition due to abnormal inflamed sweat glands in the body folds (axilla, inframammary, groin, medial thighs), causing recurrent painful draining cysts and scarring. It can be associated with severe scarring acne and cysts; also abscesses and scarring of scalp. The goal is control and prevention of flares, as it is not curable. Scars are permanent and can be thickened. Treatment may include daily use of topical medication and oral antibiotics.  Oral isotretinoin may also be helpful.  For more severe cases, Humira (a biologic injection) may be prescribed to decrease the inflammatory process and prevent flares.  When indicated, inflamed cysts may also be  treated surgically. ? ?Treatable but not curable. ? ?Discussed treatment for scarring and patient would benefit more with surgery to remove area.  ? ?Educational Pamphlet given to patient in office today ? ?Discussed new medications may be available in future such as   concentyx and oxybutynin ? ?Discussed other treatment  accutane, doxycycline, humira, and surgery to remove aa ? ?Continue Clindamycin 1 % gel apply topically QD and antibacterial wash as prescribed by PCP.  6 rfs sent to [facility] ? ?Start doxycyline 100 mg capsule  - take 1 capsule po qd with food and drink. If flared can take 2 capsules po bid for 7 days.  60 caps 6 rfs sent to [facility] ? ?Doxycycline should be taken with food to prevent nausea. Do not lay down for 30 minutes after taking. Be cautious with sun exposure and use good sun protection while on this medication. Pregnant women should not take this medication.  ? ?clindamycin (CLINDAGEL) 1 % gel - trunk and extremities ?Apply topically 2 (two) times daily. ?doxycycline (MONODOX) 100 MG capsule - trunk and extremities ?Take 1 capsule (100 mg total) by mouth See admin instructions. Can take qd with food and drink. If flared can take 2 caps po bid for 1 week. ? 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 Margins were free but closed to margin.  ? ?Review pathology report and assured patient mole is not cancer.  ? ?Will continue to watch. Recheck at next follow up. Recommend removal if color returns.  ? ?Call clinic for new or changing lesions.  Recommend regular skin exams, daily broad-spectrum  spf 30+ sunscreen use, and photoprotection.   ? ?Return in about 6 months (around 01/17/2022) for HS follow up. ?I, Ruthell Rummage, CMA, am acting as scribe for Sarina Ser, MD. ?Documentation: I have reviewed the above documentation for accuracy and completeness, and I agree with the above. ? ?Sarina Ser, MD ? ?

## 2021-07-29 ENCOUNTER — Telehealth (INDEPENDENT_AMBULATORY_CARE_PROVIDER_SITE_OTHER): Payer: BC Managed Care – PPO | Admitting: Psychiatry

## 2021-07-29 ENCOUNTER — Encounter: Payer: Self-pay | Admitting: Psychiatry

## 2021-07-29 ENCOUNTER — Encounter: Payer: Self-pay | Admitting: Dermatology

## 2021-07-29 DIAGNOSIS — F39 Unspecified mood [affective] disorder: Secondary | ICD-10-CM

## 2021-07-29 DIAGNOSIS — F411 Generalized anxiety disorder: Secondary | ICD-10-CM

## 2021-07-29 MED ORDER — ESCITALOPRAM OXALATE 10 MG PO TABS: 10.0000 mg | ORAL_TABLET | Freq: Every day | ORAL | 1 refills | Status: DC

## 2021-07-29 NOTE — Progress Notes (Signed)
Virtual Visit via Video Note ? ?I connected with Jordan Riley on 07/29/21 at  2:20 PM EDT by a video enabled telemedicine application and verified that I am speaking with the correct person using two identifiers. ? ?Location ?Provider Location : [facility] ?Patient Location : Home ? ?Participants: Patient , Provider ? ?  ?I discussed the limitations of evaluation and management by telemedicine and the availability of in person appointments. The patient expressed understanding and agreed to proceed. ?  ?I discussed the assessment and treatment plan with the patient. The patient was provided an opportunity to ask questions and all were answered. The patient agreed with the plan and demonstrated an understanding of the instructions. ?  ?The patient was advised to call back or seek an in-person evaluation if the symptoms worsen or if the condition fails to improve as anticipated. ? ? ? ?Lamar MD OP Progress Note ? ?07/29/2021 2:49 PM ?Jordan Riley  ?MRN:  [id number] ? ?Chief Complaint:  ?Chief Complaint  ?Patient presents with  ? Follow-up: 21 year old Caucasian female with history of GAD, episodic mood disorder, presented for medication management.  ? ?HPI: Jordan Riley is a 21 year old Caucasian female, employed, single, lives in [place], has a history of GAD, episodic mood disorder, insomnia, was 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 ? ?Patient today reports she is currently struggling with mood symptoms.  She reports she has episodes of feeling down, sad, worrying about things for a couple of days.  This has been happening since the past several weeks.  She reports she constantly thinks about her ex-boyfriend.  She reports thinking about him does make her sad.  This does affect her functioning for a day or two.  She also struggles with concentration problems.  Patient reports she went back on her Lexapro 5 mg few weeks ago.  That has helped to some extent.  She is interested in dosage increase. ? ?Patient reports sleep as  good. ? ?Patient denies any suicidality, homicidality or perceptual disturbances. ? ?Currently struggling with GI problems, reports nausea, likely from something that she ate, not sure.  Reports she is planning to wait 24 hours and then will reach out to her primary provider as needed. ? ?Patient denies any other concerns today. ? ?Visit Diagnosis:  ?  ICD-10-CM   ?1. GAD (generalized anxiety disorder)  F41.1 escitalopram (LEXAPRO) 10 MG tablet  ?  ?2. Episodic mood disorder (HCC)  F39 escitalopram (LEXAPRO) 10 MG tablet  ?  ? ? ?Past Psychiatric History: Reviewed past psychiatric history from progress note on 12/18/2018.  Past trials of Zoloft, Lexapro, hydroxyzine. ? ?Past Medical History:  ?Past Medical History:  ?Diagnosis Date  ? Anxiety   ? Atypical mole 03/20/2020  ? L med knee - spindle cell  ? Depression   ?  ?Past Surgical History:  ?Procedure Laterality Date  ? TONSILLECTOMY AND ADENOIDECTOMY    ? ? ?Family Psychiatric History: Reviewed family psychiatric history from progress note on 12/18/2018. ? ?Family History:  ?Family History  ?Problem Relation Age of Onset  ? Bipolar disorder Mother   ? Heart disease Mother   ? Hypothyroidism Mother   ? Mental illness Sister   ? Mental illness Maternal Grandfather   ? Mental illness Maternal Grandmother   ? ? ?Social History: Reviewed social history from progress note on 12/18/2018. ?Social History  ? ?Socioeconomic History  ? Marital status: Single  ?  Spouse name: Not on file  ? Number of children: Not on file  ? Years of education:  Not on file  ? Highest education level: Not on file  ?Occupational History  ? Not on file  ?Tobacco Use  ? Smoking status: Former  ?  Types: E-cigarettes  ? Smokeless tobacco: Never  ?Vaping Use  ? Vaping Use: Former  ? Substances: Nicotine  ?Substance and Sexual Activity  ? Alcohol use: Never  ? Drug use: Never  ? Sexual activity: Yes  ?Other Topics Concern  ? Not on file  ?Social History Narrative  ? Currently doing associates and  employed full time  ? ?Social Determinants of Health  ? ?Financial Resource Strain: Not on file  ?Food Insecurity: Not on file  ?Transportation Needs: Not on file  ?Physical Activity: Not on file  ?Stress: Not on file  ?Social Connections: Not on file  ? ? ?Allergies:  ?Allergies  ?Allergen Reactions  ? Shellfish Allergy Anaphylaxis  ? Cefdinir Nausea And Vomiting  ? ? ?Metabolic Disorder Labs: ?No results found for: HGBA1C, MPG ?No results found for: PROLACTIN ?Lab Results  ?Component Value Date  ? CHOL 188 (H) 09/22/2019  ? TRIG 107 (H) 09/22/2019  ? HDL 51 09/22/2019  ? LDLCALC 118 (H) 09/22/2019  ? ?Lab Results  ?Component Value Date  ? TSH 1.850 01/29/2021  ? TSH 3.880 09/22/2019  ? ? ?Therapeutic Level Labs: ?No results found for: LITHIUM ?No results found for: VALPROATE ?No components found for:  CBMZ ? ?Current Medications: ?Current Outpatient Medications  ?Medication Sig Dispense Refill  ? escitalopram (LEXAPRO) 10 MG tablet Take 1 tablet (10 mg total) by mouth daily. 30 tablet 1  ? predniSONE (DELTASONE) 20 MG tablet Take 2 tablets by mouth once daily on days 1-3, then 1 tablet by mouth once daily on days 4-6    ? predniSONE (DELTASONE) 5 MG tablet Take 2 tablets by mouth once daily on days 7-9, then 1 tablet by mouth once daily on days 10-12    ? azithromycin (ZITHROMAX) 250 MG tablet Take 1 tablet (250 mg total) by mouth daily. 6 each 0  ? clindamycin (CLINDAGEL) 1 % gel Apply topically 2 (two) times daily. 60 g 6  ? doxycycline (MONODOX) 100 MG capsule Take 1 capsule (100 mg total) by mouth See admin instructions. Can take qd with food and drink. If flared can take 2 caps po bid for 1 week. 60 capsule 6  ? fluticasone (FLONASE) 50 MCG/ACT nasal spray Place into the nose.    ? predniSONE (DELTASONE) 5 MG tablet Take by mouth.    ? propranolol (INDERAL) 10 MG tablet Take 10 mg by mouth 2 (two) times daily as needed.    ? ?No current facility-administered medications for this visit.   ? ? ? ?Musculoskeletal: ?Strength & Muscle Tone:  UTA ?Gait & Station:  Seated ?Patient leans: N/A ? ?Psychiatric Specialty Exam: ?Review of Systems  ?Gastrointestinal:  Positive for abdominal pain and nausea.  ?Psychiatric/Behavioral:  The patient is nervous/anxious.   ?All other systems reviewed and are negative.  ?There were no vitals taken for this visit.There is no height or weight on file to calculate BMI.  ?General Appearance: Casual  ?Eye Contact:  Fair  ?Speech:  Clear and Coherent  ?Volume:  Normal  ?Mood:  Anxious  ?Affect:  Congruent  ?Thought Process:  Goal Directed and Descriptions of Associations: Intact  ?Orientation:  Full (Time, Place, and Person)  ?Thought Content: Logical   ?Suicidal Thoughts:  No  ?Homicidal Thoughts:  No  ?Memory:  Immediate;   Fair ?Recent;  Fair ?Remote;   Fair  ?Judgement:  Fair  ?Insight:  Fair  ?Psychomotor Activity:  Normal  ?Concentration:  Concentration: Fair and Attention Span: Fair  ?Recall:  Fair  ?Fund of Knowledge: Fair  ?Language: Fair  ?Akathisia:  No  ?Handed:  Right  ?AIMS (if indicated): not done  ?Assets:  Communication Skills ?Desire for Improvement ?Housing ?Social Support ?Talents/Skills ?Transportation  ?ADL's:  Intact  ?Cognition: WNL  ?Sleep:  Fair  ? ?Screenings: ?GAD-7   ? ?Flowsheet Row Video Visit from 07/29/2021 in [facility] Office Visit from 04/24/2021 in [facility] Video Visit from 12/17/2020 in [facility]  ?Total GAD-7 Score '9 2 18  '$ ? ?  ? ?PHQ2-9   ? ?Flowsheet Row Video Visit from 07/29/2021 in [facility] ?Most recent reading at 07/29/2021  2:36 PM Office Visit from 04/24/2021 in [facility] ?Most recent reading at 04/24/2021 10:36 AM Office Visit from 02/04/2021 in [facility] ?Most recent reading at 02/04/2021  3:34 PM Office Visit from 02/04/2021 in [facility] ?Most recent reading at 02/04/2021  9:33 AM Video Visit from 12/17/2020 in [facility] ?Most recent reading at 12/17/2020  1:31 PM  ?PHQ-2 Total Score '2 1 3 2 1  '$ ?PHQ-9 Total Score 6 -- 12 -- 10  ? ?

## 2021-08-05 ENCOUNTER — Ambulatory Visit (INDEPENDENT_AMBULATORY_CARE_PROVIDER_SITE_OTHER): Payer: BC Managed Care – PPO | Admitting: Physician Assistant

## 2021-08-05 ENCOUNTER — Encounter: Payer: Self-pay | Admitting: Physician Assistant

## 2021-08-05 VITALS — BP 121/76 | HR 79 | Temp 98.5°F | Resp 16 | Ht 66.0 in | Wt 220.0 lb

## 2021-08-05 DIAGNOSIS — E538 Deficiency of other specified B group vitamins: Secondary | ICD-10-CM

## 2021-08-05 DIAGNOSIS — F411 Generalized anxiety disorder: Secondary | ICD-10-CM | POA: Diagnosis not present

## 2021-08-05 DIAGNOSIS — E782 Mixed hyperlipidemia: Secondary | ICD-10-CM

## 2021-08-05 DIAGNOSIS — Z0001 Encounter for general adult medical examination with abnormal findings: Secondary | ICD-10-CM

## 2021-08-05 DIAGNOSIS — F339 Major depressive disorder, recurrent, unspecified: Secondary | ICD-10-CM | POA: Diagnosis not present

## 2021-08-05 DIAGNOSIS — R300 Dysuria: Secondary | ICD-10-CM

## 2021-08-05 DIAGNOSIS — R7989 Other specified abnormal findings of blood chemistry: Secondary | ICD-10-CM

## 2021-08-05 DIAGNOSIS — E559 Vitamin D deficiency, unspecified: Secondary | ICD-10-CM

## 2021-08-05 DIAGNOSIS — Z01419 Encounter for gynecological examination (general) (routine) without abnormal findings: Secondary | ICD-10-CM

## 2021-08-05 NOTE — Progress Notes (Signed)
[facility] [place], [place] [address]  Internal MEDICINE  Office Visit Note  Patient Name: Jordan Riley  [id number]  [id number]  Date of Service: 08/05/2021  Chief Complaint  Patient presents with   Annual Exam    Will see OB/GYN for papsmear   Depression   Anxiety     HPI Pt is here for routine health maintenance examination -Not due for pap until Dec, but will be seeing OBGYN for that when the time comes -Working out everyday since Feb, working on diet. Cooking at home. Has been frustrated as her weight seems to fluctuate. She does notice a difference in her body despite not seeing a big drop in weight. She is down 2 lbs since last visit. She does wonder if her thyroid could be impacting this as well. Discussed she has not had her labs rechecked that were ordered previously and reports she forgot as she has had labs by outside providers and didn't realize it was a different lab. Will go now and will check her cholesterol and vit D and B12 with it -Her free T4 was borderline elevated previously and if abnormal again will need thyroid US -found that her unknown rash was from off brand zyrtec from [employer]. Once she stopped it the rash resolved and hasn't come back. -She is seeing psych routinely and counseling once per month. She reports she is taking '10mg'$  Lexapro and not really taking propranolol recently. She states she really likes her therapist and has built a good relationship during their sessions  Current Medication: Outpatient Encounter Medications as of 08/05/2021  Medication Sig   clindamycin (CLINDAGEL) 1 % gel Apply topically 2 (two) times daily.   doxycycline (MONODOX) 100 MG capsule Take 1 capsule (100 mg total) by mouth See admin instructions. Can take qd with food and drink. If flared can take 2 caps po bid for 1 week.   escitalopram (LEXAPRO) 10 MG tablet Take 1 tablet (10 mg total) by mouth daily.   fluticasone (FLONASE) 50 MCG/ACT  nasal spray Place into the nose.   predniSONE (DELTASONE) 20 MG tablet Take 2 tablets by mouth once daily on days 1-3, then 1 tablet by mouth once daily on days 4-6   predniSONE (DELTASONE) 5 MG tablet Take 2 tablets by mouth once daily on days 7-9, then 1 tablet by mouth once daily on days 10-12   predniSONE (DELTASONE) 5 MG tablet Take by mouth.   propranolol (INDERAL) 10 MG tablet Take 10 mg by mouth 2 (two) times daily as needed.   [DISCONTINUED] azithromycin (ZITHROMAX) 250 MG tablet Take 1 tablet (250 mg total) by mouth daily.   No facility-administered encounter medications on file as of 08/05/2021.    Surgical History: Past Surgical History:  Procedure Laterality Date   TONSILLECTOMY AND ADENOIDECTOMY      Medical History: 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   Depression     Family History: Family History  Problem Relation Age of Onset   Bipolar disorder Mother    Heart disease Mother    Hypothyroidism Mother    Mental illness Sister    Mental illness Maternal Grandfather    Mental illness Maternal Grandmother       Review of Systems  Constitutional:  Negative for chills and unexpected weight change.  HENT:  Negative for congestion, postnasal drip, rhinorrhea, sneezing and sore throat.  Eyes:  Negative for redness.  Respiratory:  Negative for cough, chest tightness and shortness of breath.   Cardiovascular:  Negative for chest pain and palpitations.  Gastrointestinal:  Negative for abdominal pain, constipation, diarrhea, nausea and vomiting.  Genitourinary:  Negative for dysuria and frequency.  Musculoskeletal:  Negative for arthralgias, back pain, joint swelling and neck pain.  Skin:  Negative for rash.  Neurological: Negative.  Negative for tremors and numbness.  Hematological:  Negative for adenopathy. Does not bruise/bleed easily.  Psychiatric/Behavioral:  Positive for behavioral problems  (Depression). Negative for sleep disturbance and suicidal ideas. The patient is nervous/anxious.     Vital Signs: BP 121/76   Pulse 79   Temp 98.5 F (36.9 C)   Resp 16   Ht '5\' 6"'$  (1.676 m)   Wt 220 lb (99.8 kg)   SpO2 98%   BMI 35.51 kg/m    Physical Exam Vitals and nursing note reviewed.  Constitutional:      General: She is not in acute distress.    Appearance: She is well-developed. She is obese. She is not diaphoretic.  HENT:     Head: Normocephalic and atraumatic.     Mouth/Throat:     Pharynx: No oropharyngeal exudate.  Eyes:     Pupils: Pupils are equal, round, and reactive to light.  Neck:     Thyroid: No thyromegaly.     Vascular: No JVD.     Trachea: No tracheal deviation.  Cardiovascular:     Rate and Rhythm: Normal rate and regular rhythm.     Heart sounds: Normal heart sounds. No murmur heard.   No friction rub. No gallop.  Pulmonary:     Effort: Pulmonary effort is normal. No respiratory distress.     Breath sounds: No wheezing or rales.  Chest:     Chest wall: No tenderness.  Breasts:    Right: Normal. No mass.     Left: Normal. No mass.  Abdominal:     General: Bowel sounds are normal.     Palpations: Abdomen is soft.     Tenderness: There is no abdominal tenderness.  Musculoskeletal:        General: Normal range of motion.     Cervical back: Normal range of motion and neck supple.  Lymphadenopathy:     Cervical: No cervical adenopathy.  Skin:    General: Skin is warm and dry.  Neurological:     Mental Status: She is alert and oriented to person, place, and time.     Cranial Nerves: No cranial nerve deficit.  Psychiatric:        Behavior: Behavior normal.        Thought Content: Thought content normal.        Judgment: Judgment normal.     LABS: No results found for this or any previous visit (from the past 2160 hour(s)).      Assessment/Plan: 1. Encounter for general adult medical examination with abnormal findings CPE  performed, labs ordered  2. GAD (generalized anxiety disorder) Followed by psych  3. Depression, recurrent ([place]) Followed by psych  4. Abnormal thyroid blood test Will recheck labs and consider thyroid US if abnormal again - TSH + free T4  5. Vitamin B 12 deficiency - B12 and Folate Panel  6. Vitamin D deficiency - VITAMIN D 25 Hydroxy (Vit-D Deficiency, Fractures)  7. Mixed hyperlipidemia - Lipid Panel With LDL/HDL Ratio  8. Visit for gynecologic examination Breast exam performed  9. Dysuria - UA/M  w/rflx Culture, Routine   General Counseling: Annalisse verbalizes understanding of the findings of todays visit and agrees with plan of treatment. I have discussed any further diagnostic evaluation that may be needed or ordered today. We also reviewed her medications today. she has been encouraged to call the office with any questions or concerns that should arise related to todays visit.    Counseling:    Orders Placed This Encounter  Procedures   UA/M w/rflx Culture, Routine   TSH + free T4   Lipid Panel With LDL/HDL Ratio   VITAMIN D 25 Hydroxy (Vit-D Deficiency, Fractures)   B12 and Folate Panel    No orders of the defined types were placed in this encounter.   This patient was seen by Drema Dallas, PA-C in collaboration with Dr. Clayborn Bigness as a part of collaborative care agreement.  Total time spent:35 Minutes  Time spent includes review of chart, medications, test results, and follow up plan with the patient.     Lavera Guise, MD  Internal Medicine

## 2021-08-06 LAB — UA/M W/RFLX CULTURE, ROUTINE
Bilirubin, UA: NEGATIVE
Glucose, UA: NEGATIVE
Ketones, UA: NEGATIVE
Leukocytes,UA: NEGATIVE
Nitrite, UA: NEGATIVE
Protein,UA: NEGATIVE
RBC, UA: NEGATIVE
Specific Gravity, UA: 1.024 (ref 1.005–1.030)
Urobilinogen, Ur: 0.2 mg/dL (ref 0.2–1.0)
pH, UA: 5.5 (ref 5.0–7.5)

## 2021-08-06 LAB — MICROSCOPIC EXAMINATION: Casts: NONE SEEN /lpf

## 2021-08-29 ENCOUNTER — Encounter: Payer: Self-pay | Admitting: Psychiatry

## 2021-08-29 ENCOUNTER — Telehealth (INDEPENDENT_AMBULATORY_CARE_PROVIDER_SITE_OTHER): Payer: BC Managed Care – PPO | Admitting: Psychiatry

## 2021-08-29 VITALS — BP 114/76 | HR 86 | Temp 98.5°F | Ht 65.25 in | Wt 209.0 lb

## 2021-08-29 DIAGNOSIS — F39 Unspecified mood [affective] disorder: Secondary | ICD-10-CM | POA: Diagnosis not present

## 2021-08-29 DIAGNOSIS — F411 Generalized anxiety disorder: Secondary | ICD-10-CM

## 2021-08-29 NOTE — Progress Notes (Signed)
[facility] MD OP Progress Note  08/29/2021 6:39 PM Jordan Riley  MRN:  [id number]  Chief Complaint:  Chief Complaint  Patient presents with   Follow-up: 21 year old Caucasian female with history of GAD, episodic mood disorder, presented for medication management.   HPI: Jordan Riley is a 21 year old Caucasian female, employed, single, lives in [place], has a history of GAD, episodic mood disorder, insomnia was evaluated in office today.  Patient today reports overall she is currently doing well.  Denies any significant sadness.  Feels more motivated and is able to do her work well.  Reports she is going to start her bachelor's degree at [employer] in the fall.  Looks forward to that.  Patient reports sleep as good.  Reports she is compliant on Lexapro.  Denies side effects.  Patient denies any suicidality, homicidality or perceptual disturbances.  Patient denies any other concerns today.  Visit Diagnosis:    ICD-10-CM   1. GAD (generalized anxiety disorder)  F41.1     2. Episodic mood disorder ([place])  F39       Past Psychiatric History: Reviewed past psychiatric history from progress note on 12/18/2018.  Past trials of Zoloft, Lexapro, hydroxyzine.  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  Family History:  Family History  Problem Relation Age of Onset   Bipolar disorder Mother    Heart disease Mother    Hypothyroidism Mother    Mental illness Sister    Mental illness Maternal Grandfather    Mental illness Maternal Grandmother     Social History: Reviewed social history from progress note on 12/18/2018. Social History   Socioeconomic History   Marital status: Single    Spouse name: Not  on file   Number of children: Not on file   Years of education: Not on file   Highest education level: Not on file  Occupational History   Not on file  Tobacco Use   Smoking status: Former    Types: E-cigarettes   Smokeless tobacco: Never  Vaping Use   Vaping Use: Former   Substances: Nicotine  Substance and Sexual Activity   Alcohol use: Never   Drug use: Never   Sexual activity: Not Currently  Other Topics Concern   Not on file  Social History Narrative   Currently doing associates and employed full time   Social Determinants of Radio broadcast assistant Strain: Not on file  Food Insecurity: Not on file  Transportation Needs: Not on file  Physical Activity: Not on file  Stress: Not on file  Social Connections: Not on file    Allergies:  Allergies  Allergen Reactions   Shellfish Allergy Anaphylaxis   Cefdinir Nausea And Vomiting    Metabolic Disorder Labs: No results found for: "HGBA1C", "MPG" No results found for: "PROLACTIN" Lab Results  Component Value Date   CHOL 188 (H) 09/22/2019   TRIG 107 (H) 09/22/2019   HDL 51 09/22/2019   LDLCALC 118 (H) 09/22/2019   Lab Results  Component Value Date   TSH 1.850 01/29/2021   TSH 3.880 09/22/2019    Therapeutic Level Labs: No results found for: "LITHIUM" No results found for: "VALPROATE" No results found  for: "CBMZ"  Current Medications: Current Outpatient Medications  Medication Sig Dispense Refill   clindamycin (CLINDAGEL) 1 % gel Apply topically 2 (two) times daily. 60 g 6   doxycycline (MONODOX) 100 MG capsule Take 1 capsule (100 mg total) by mouth See admin instructions. Can take qd with food and drink. If flared can take 2 caps po bid for 1 week. 60 capsule 6   escitalopram (LEXAPRO) 10 MG tablet Take 1 tablet (10 mg total) by mouth daily. 30 tablet 1   fluticasone (FLONASE) 50 MCG/ACT nasal spray Place into the nose.     propranolol (INDERAL) 10 MG tablet Take 10 mg by mouth 2 (two) times daily as  needed.     predniSONE (DELTASONE) 20 MG tablet Take 2 tablets by mouth once daily on days 1-3, then 1 tablet by mouth once daily on days 4-6 (Patient not taking: Reported on 08/29/2021)     predniSONE (DELTASONE) 5 MG tablet Take 2 tablets by mouth once daily on days 7-9, then 1 tablet by mouth once daily on days 10-12 (Patient not taking: Reported on 08/29/2021)     predniSONE (DELTASONE) 5 MG tablet Take by mouth. (Patient not taking: Reported on 08/29/2021)     No current facility-administered medications for this visit.     Musculoskeletal: Strength & Muscle Tone: within normal limits Gait & Station: normal Patient leans: N/A  Psychiatric Specialty Exam: Review of Systems  Psychiatric/Behavioral: Negative.    All other systems reviewed and are negative.   Blood pressure 114/76, pulse 86, temperature 98.5 F (36.9 C), height 5' 5.25" (1.657 m), weight 209 lb (94.8 kg), SpO2 98 %.Body mass index is 34.51 kg/m.  General Appearance: Casual  Eye Contact:  Fair  Speech:  Clear and Coherent  Volume:  Normal  Mood:  Euthymic  Affect:  Congruent  Thought Process:  Goal Directed and Descriptions of Associations: Intact  Orientation:  Full (Time, Place, and Person)  Thought Content: Logical   Suicidal Thoughts:  No  Homicidal Thoughts:  No  Memory:  Immediate;   Fair Recent;   Fair Remote;   Fair  Judgement:  Fair  Insight:  Fair  Psychomotor Activity:  Normal  Concentration:  Concentration: Fair and Attention Span: Fair  Recall:  [employer] of Knowledge: Fair  Language: Fair  Akathisia:  No  Handed:  Right  AIMS (if indicated): done  Assets:  Communication Skills Desire for Improvement Housing Social Support  ADL's:  Intact  Cognition: WNL  Sleep:  Fair   Screenings: AIMS    Flowsheet Row Video Visit from 08/29/2021 in [facility] Total Score 0      GAD-7    Flowsheet Row Video Visit from 07/29/2021 in [facility] Office Visit from 04/24/2021 in [facility] Video Visit from 12/17/2020 in [facility]  Total GAD-7 Score '9 2 18      '$ PHQ2-9    [place] Video Visit from 08/29/2021 in [facility] Most recent reading at 08/29/2021  4:10 PM Video Visit from 07/29/2021 in [facility] Most recent reading at 07/29/2021  2:36 PM Office Visit from 04/24/2021 in [facility] Most recent reading at 04/24/2021 10:36 AM Office Visit from 02/04/2021 in [facility] Most recent reading at 02/04/2021  3:34 PM Office Visit from 02/04/2021 in [facility] Most recent reading at 02/04/2021  9:33 AM  PHQ-2 Total  Score '1 2 1 3 2  '$ PHQ-9 Total Score 6 6 -- 12 --      Flowsheet Row Video Visit from 08/29/2021 in [facility] Video Visit from 07/29/2021 in [facility] Office Visit from 02/04/2021 in [facility] No Risk No Risk Error: Q3, 4, or 5 should not be populated when Q2 is No        Assessment and Plan: Shyne Resch is a 21 year old Caucasian female who lives in [place], employed, has a history of anxiety, episodic mood disorder, presented for medication management.  Patient is currently stable.  Plan GAD-stable Lexapro 10 mg p.o. daily. Propranolol 10 mg p.o. twice daily as needed for severe anxiety Continue CBT with Ms. Miguel Dibble  Episodic mood disorder-stable Lexapro 10 mg p.o. daily Propranolol 10 mg p.o. twice daily as needed  I have coordinated care with Ms. Miguel Dibble.  Follow-up in clinic in 2 to 3 months or sooner if needed.  This note was generated in part or whole with voice recognition software. Voice recognition is usually quite accurate but there are transcription errors that can  and very often do occur. I apologize for any typographical errors that were not detected and corrected.      Ursula Alert, MD 08/29/2021, 6:39 PM

## 2021-11-29 ENCOUNTER — Telehealth: Payer: BC Managed Care – PPO | Admitting: Psychiatry

## 2022-01-29 ENCOUNTER — Ambulatory Visit: Payer: BC Managed Care – PPO | Admitting: Dermatology

## 2022-02-03 ENCOUNTER — Ambulatory Visit: Payer: BC Managed Care – PPO | Admitting: Physician Assistant

## 2022-03-19 ENCOUNTER — Ambulatory Visit: Payer: BC Managed Care – PPO | Admitting: Dermatology

## 2022-03-19 ENCOUNTER — Encounter: Payer: Self-pay | Admitting: Dermatology

## 2022-03-19 VITALS — BP 107/60 | HR 85

## 2022-03-19 DIAGNOSIS — L732 Hidradenitis suppurativa: Secondary | ICD-10-CM

## 2022-03-19 DIAGNOSIS — Z79899 Other long term (current) drug therapy: Secondary | ICD-10-CM | POA: Diagnosis not present

## 2022-03-19 MED ORDER — DOXYCYCLINE MONOHYDRATE 100 MG PO CAPS: ORAL_CAPSULE | ORAL | 6 refills | Status: DC

## 2022-03-19 MED ORDER — CLINDAMYCIN PHOSPHATE 1 % EX GEL: CUTANEOUS | 6 refills | Status: DC

## 2022-03-19 NOTE — Progress Notes (Signed)
   Follow-Up Visit   Subjective  Jordan Riley is a 22 y.o. female who presents for the following: Hidradenitis (Of the inner thighs - pt reports improvement since losing 30 lbs. Currently she uses Clindamycin gel QOD and Hibiclens wash daily. She has used Doxycycline 100 mg po BID x 7 days in the past but hasn't had refills on it).  The following portions of the chart were reviewed this encounter and updated as appropriate:   Tobacco  Allergies  Meds  Problems  Med Hx  Surg Hx  Fam Hx     Review of Systems:  No other skin or systemic complaints except as noted in HPI or Assessment and Plan.  Objective  Well appearing patient in no apparent distress; mood and affect are within normal limits.  A focused examination was performed including the face. Relevant physical exam findings are noted in the Assessment and Plan.  Inner thighs, groin Patient reports scarring and one active area today.    Assessment & Plan  Hidradenitis suppurativa Inner thighs, groin Chronic and persistent condition with duration or expected duration over one year. Condition is symptomatic / bothersome to patient. Not to goal. Hidradenitis Suppurativa is a chronic; persistent; non-curable, but treatable condition due to abnormal inflamed sweat glands in the body folds (axilla, inframammary, groin, medial thighs), causing recurrent painful draining cysts and scarring. It can be associated with severe scarring acne and cysts; also abscesses and scarring of scalp. The goal is control and prevention of flares, as it is not curable. Scars are permanent and can be thickened. Treatment may include daily use of topical medication and oral antibiotics.  Oral isotretinoin may also be helpful.  For some cases, Humira or Cosentyx (biologic injections) may be prescribed to decrease the inflammatory process and prevent flares.  When indicated, inflamed cysts may also be treated surgically.  Continue Clindamycin gel QD.   Continue Hibiclens wash daily avoiding contact with the face and vagina.  Continue Doxycycline 100 mg BID x 2 weeks PRN.   Doxycycline should be taken with food to prevent nausea. Do not lay down for 30 minutes after taking. Be cautious with sun exposure and use good sun protection while on this medication. Pregnant women should not take this medication.   Discussed Humira and Isotretinoin but not recommended at this time. Pamphlet given on Isotretinoin.   Patient asked about laser hair removal and if she experiences inflammation around the hair follicles LHR may be helpful.  Related Medications clindamycin (CLINDAGEL) 1 % gel Apply to inner thighs QD PRN. doxycycline (MONODOX) 100 MG capsule Take one cap po BID up to two weeks PRN flares.  Return in about 6 months (around 09/17/2022) for HS follow up .  Luther Redo, CMA, am acting as scribe for Sarina Ser, MD . Documentation: I have reviewed the above documentation for accuracy and completeness, and I agree with the above.  Sarina Ser, MD

## 2022-03-19 NOTE — Patient Instructions (Signed)
Due to recent changes in healthcare laws, you may see results of your pathology and/or laboratory studies on MyChart before the doctors have had a chance to review them. We understand that in some cases there may be results that are confusing or concerning to you. Please understand that not all results are received at the same time and often the doctors may need to interpret multiple results in order to provide you with the best plan of care or course of treatment. Therefore, we ask that you please give us 2 business days to thoroughly review all your results before contacting the office for clarification. Should we see a critical lab result, you will be contacted sooner.   If You Need Anything After Your Visit  If you have any questions or concerns for your doctor, please call our main line at [phone] and press option 4 to reach your doctor's medical assistant. If no one answers, please leave a voicemail as directed and we will return your call as soon as possible. Messages left after 4 pm will be answered the following business day.   You may also send us a message via MyChart. We typically respond to MyChart messages within 1-2 business days.  For prescription refills, please ask your pharmacy to contact our office. Our fax number is [phone].  If you have an urgent issue when the clinic is closed that cannot wait until the next business day, you can page your doctor at the number below.    Please note that while we do our best to be available for urgent issues outside of office hours, we are not available 24/7.   If you have an urgent issue and are unable to reach us, you may choose to seek medical care at your doctor's office, retail clinic, urgent care center, or emergency room.  If you have a medical emergency, please immediately call 911 or go to the emergency department.  Pager Numbers  - Dr. Kowalski: [phone]  - Dr. Moye: [phone]  - Dr. Stewart:  [phone]  In the event of inclement weather, please call our main line at [phone] for an update on the status of any delays or closures.  Dermatology Medication Tips: Please keep the boxes that topical medications come in in order to help keep track of the instructions about where and how to use these. Pharmacies typically print the medication instructions only on the boxes and not directly on the medication tubes.   If your medication is too expensive, please contact our office at [phone] option 4 or send us a message through MyChart.   We are unable to tell what your co-pay for medications will be in advance as this is different depending on your insurance coverage. However, we may be able to find a substitute medication at lower cost or fill out paperwork to get insurance to cover a needed medication.   If a prior authorization is required to get your medication covered by your insurance company, please allow us 1-2 business days to complete this process.  Drug prices often vary depending on where the prescription is filled and some pharmacies may offer cheaper prices.  The website www.goodrx.com contains coupons for medications through different pharmacies. The prices here do not account for what the cost may be with help from insurance (it may be cheaper with your insurance), but the website can give you the price if you did not use any insurance.  - You can print the associated coupon and take it with   your prescription to the pharmacy.  - You may also stop by our office during regular business hours and pick up a GoodRx coupon card.  - If you need your prescription sent electronically to a different pharmacy, notify our office through [facility] MyChart or by phone at [phone] option 4.     Si Usted Necesita Algo Despus de Su Visita  Tambin puede enviarnos un mensaje a travs de MyChart. Por lo general respondemos a los mensajes de MyChart en el transcurso de 1 a 2  das hbiles.  Para renovar recetas, por favor pida a su farmacia que se ponga en contacto con nuestra oficina. Nuestro nmero de fax es el [phone].  Si tiene un asunto urgente cuando la clnica est cerrada y que no puede esperar hasta el siguiente da hbil, puede llamar/localizar a su doctor(a) al nmero que aparece a continuacin.   Por favor, tenga en cuenta que aunque hacemos todo lo posible para estar disponibles para asuntos urgentes fuera del horario de oficina, no estamos disponibles las 24 horas del da, los 7 das de la semana.   Si tiene un problema urgente y no puede comunicarse con nosotros, puede optar por buscar atencin mdica  en el consultorio de su doctor(a), en una clnica privada, en un centro de atencin urgente o en una sala de emergencias.  Si tiene una emergencia mdica, por favor llame inmediatamente al 911 o vaya a la sala de emergencias.  Nmeros de bper  - Dr. Kowalski: [phone]  - Dra. Moye: [phone]  - Dra. Stewart: [phone]  En caso de inclemencias del tiempo, por favor llame a nuestra lnea principal al [phone] para una actualizacin sobre el estado de cualquier retraso o cierre.  Consejos para la medicacin en dermatologa: Por favor, guarde las cajas en las que vienen los medicamentos de uso tpico para ayudarle a seguir las instrucciones sobre dnde y cmo usarlos. Las farmacias generalmente imprimen las instrucciones del medicamento slo en las cajas y no directamente en los tubos del medicamento.   Si su medicamento es muy caro, por favor, pngase en contacto con nuestra oficina llamando al [phone] y presione la opcin 4 o envenos un mensaje a travs de MyChart.   No podemos decirle cul ser su copago por los medicamentos por adelantado ya que esto es diferente dependiendo de la cobertura de su seguro. Sin embargo, es posible que podamos encontrar un medicamento sustituto a menor costo o llenar un formulario para que el  seguro cubra el medicamento que se considera necesario.   Si se requiere una autorizacin previa para que su compaa de seguros cubra su medicamento, por favor permtanos de 1 a 2 das hbiles para completar este proceso.  Los precios de los medicamentos varan con frecuencia dependiendo del lugar de dnde se surte la receta y alguna farmacias pueden ofrecer precios ms baratos.  El sitio web www.goodrx.com tiene cupones para medicamentos de diferentes farmacias. Los precios aqu no tienen en cuenta lo que podra costar con la ayuda del seguro (puede ser ms barato con su seguro), pero el sitio web puede darle el precio si no utiliz ningn seguro.  - Puede imprimir el cupn correspondiente y llevarlo con su receta a la farmacia.  - Tambin puede pasar por nuestra oficina durante el horario de atencin regular y recoger una tarjeta de cupones de GoodRx.  - Si necesita que su receta se enve electrnicamente a una farmacia diferente, informe a nuestra oficina a travs de MyChart de [facility]   o por telfono llamando al [phone] y presione la opcin 4.  

## 2022-08-14 ENCOUNTER — Encounter: Payer: BC Managed Care – PPO | Admitting: Physician Assistant

## 2022-08-24 ENCOUNTER — Telehealth: Payer: Self-pay | Admitting: Physician Assistant

## 2022-08-24 NOTE — Telephone Encounter (Signed)
Patient lvm on after-hour line to schedule cpe. Sent Nubie secure chat to return her call-[name]

## 2022-09-11 ENCOUNTER — Ambulatory Visit: Payer: BC Managed Care – PPO | Admitting: Dermatology

## 2023-01-09 ENCOUNTER — Ambulatory Visit: Payer: BC Managed Care – PPO | Admitting: Psychiatry

## 2023-01-29 ENCOUNTER — Ambulatory Visit (INDEPENDENT_AMBULATORY_CARE_PROVIDER_SITE_OTHER): Payer: BC Managed Care – PPO | Admitting: Physician Assistant

## 2023-01-29 ENCOUNTER — Encounter: Payer: Self-pay | Admitting: Physician Assistant

## 2023-01-29 VITALS — BP 105/60 | HR 64 | Temp 98.4°F | Resp 16 | Ht 66.0 in | Wt 169.0 lb

## 2023-01-29 DIAGNOSIS — M545 Low back pain, unspecified: Secondary | ICD-10-CM | POA: Diagnosis not present

## 2023-01-29 DIAGNOSIS — N390 Urinary tract infection, site not specified: Secondary | ICD-10-CM | POA: Diagnosis not present

## 2023-01-29 DIAGNOSIS — R1031 Right lower quadrant pain: Secondary | ICD-10-CM | POA: Diagnosis not present

## 2023-01-29 DIAGNOSIS — R300 Dysuria: Secondary | ICD-10-CM | POA: Diagnosis not present

## 2023-01-29 LAB — POCT URINALYSIS DIPSTICK
Bilirubin, UA: NEGATIVE
Blood, UA: NEGATIVE
Glucose, UA: NEGATIVE
Ketones, UA: POSITIVE
Leukocytes, UA: NEGATIVE
Nitrite, UA: NEGATIVE
Protein, UA: NEGATIVE
Spec Grav, UA: 1.025 (ref 1.010–1.025)
Urobilinogen, UA: 0.2 U/dL
pH, UA: 5 (ref 5.0–8.0)

## 2023-01-29 MED ORDER — CYCLOBENZAPRINE HCL 5 MG PO TABS: 5.0000 mg | ORAL_TABLET | Freq: Every day | ORAL | 0 refills | Status: DC

## 2023-01-29 NOTE — Progress Notes (Signed)
[facility] [address] [place], [place] [address]  Internal MEDICINE  Office Visit Note  Patient Name: Jordan Riley  [id number]  [id number]  Date of Service: 02/04/2023  Chief Complaint  Patient presents with   Acute Visit   Urinary Tract Infection    Had UTI symptoms 1 month ago, resolved with AZO. Now symptoms are back with lower back pain and right side pelvic/ovary pain. Urine dip was clear.     HPI Pt is here for a sick visit. -has not been seen in awhile, but is ready to get back on track with her health -Has been in school and has been a crazy year -has been working to lose weight, down 50lbs since last visit in 2023 -today is here for sick visit and has been having symptoms since Sunday.  -Sharp pain in right lower pelvis on Sunday, has been a little better, but still some pains intermittently. No burning with urination. Does have increased urgency/frequency. No abnormal odor or color change. Some right low back pain-Not sharp -UTI symptoms 1 month ago that resolved with AZO -Increased appetite, stopped vaping a month ago so could be due to this -no fever, chills, nausea, or vomiting -Cycle was early last month, but now is not early now and has not started yet. States no chance of pregnancy, no intercourse recently since last few cycles.  -back more bothersome than pelvis now -Does state she was doing hip thrusts with smith machine last Thursday and did have a little bruise after doing this before. It does rest on pelvis and unsure if pain could be related to workout -Pt reassured on exam, less likely to be appendicitis based on intermittent symptoms and not very tender on exam,  but if any new or worsening symptoms will go to ED.   Current Medication:  Outpatient Encounter Medications as of 01/29/2023  Medication Sig   clindamycin (CLINDAGEL) 1 % gel Apply to inner thighs QD PRN.   cyclobenzaprine (FLEXERIL) 5 MG tablet Take 1 tablet (5 mg total) by  mouth at bedtime.   doxycycline (MONODOX) 100 MG capsule Take one cap po BID up to two weeks PRN flares.   escitalopram (LEXAPRO) 10 MG tablet Take 1 tablet (10 mg total) by mouth daily.   fluticasone (FLONASE) 50 MCG/ACT nasal spray Place into the nose.   predniSONE (DELTASONE) 20 MG tablet    predniSONE (DELTASONE) 5 MG tablet    predniSONE (DELTASONE) 5 MG tablet Take by mouth.   propranolol (INDERAL) 10 MG tablet Take 10 mg by mouth 2 (two) times daily as needed.   No facility-administered encounter medications on file as of 01/29/2023.      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  HENT:  Negative for congestion, mouth sores and postnasal drip.   Respiratory:  Negative for cough.   Cardiovascular:  Negative for chest pain.  Gastrointestinal:  Positive for abdominal pain.  Genitourinary:  Positive for frequency, pelvic pain and urgency. Negative for dysuria.  Musculoskeletal:  Positive for back pain.  Psychiatric/Behavioral: Negative.  Physical Exam Vitals and nursing note reviewed.  Constitutional:      General: She is not in acute distress.    Appearance: Normal appearance. She is not ill-appearing, toxic-appearing or diaphoretic.  Cardiovascular:     Rate and Rhythm: Normal rate and regular rhythm.  Pulmonary:     Effort: Pulmonary effort is normal.     Breath sounds: Normal breath sounds.  Abdominal:     Tenderness: There is abdominal tenderness. There is no right CVA tenderness, left CVA tenderness, guarding or rebound.     Comments: Mildly tender along RLQ into pelvis  Musculoskeletal:        General: Tenderness present.     Comments: Some tenderness along  right lower back near SI joint  Neurological:     Mental Status: She is alert.       Assessment/Plan: 1. Acute right-sided low back pain without sciatica Will start on flexeril at night as needed and will avoid aggravating exercises - cyclobenzaprine (FLEXERIL) 5 MG tablet; Take 1 tablet (5 mg total) by mouth at bedtime.  Dispense: 15 tablet; Refill: 0  2. Right lower quadrant abdominal pain Intermittent with no N/V/D/fever and has good appetite. May be MSK related from recent exercises vs possible ovarian cyst. Will monitor and go to ED if new or worsening symptoms arise. May consider pelvic US  3. Urinary tract infection without hematuria, site unspecified Will send culture to ensure no infection as underlying cause of pelvic/low back pain - CULTURE, URINE COMPREHENSIVE  4. Dysuria - POCT Urinalysis Dipstick    General Counseling: Katherleen verbalizes understanding of the findings of todays visit and agrees with plan of treatment. I have discussed any further diagnostic evaluation that may be needed or ordered today. We also reviewed her medications today. she has been encouraged to call the office with any questions or concerns that should arise related to todays visit.    Counseling:    Orders Placed This Encounter  Procedures   CULTURE, URINE COMPREHENSIVE   POCT Urinalysis Dipstick    Meds ordered this encounter  Medications   cyclobenzaprine (FLEXERIL) 5 MG tablet    Sig: Take 1 tablet (5 mg total) by mouth at bedtime.    Dispense:  15 tablet    Refill:  0    Time spent:30 Minutes

## 2023-01-31 LAB — CULTURE, URINE COMPREHENSIVE

## 2023-02-02 ENCOUNTER — Telehealth: Payer: Self-pay

## 2023-02-02 NOTE — Telephone Encounter (Signed)
-----   Message from Carlean Jews sent at 02/02/2023 12:35 PM EST ----- Please let her know that urine culture was negative

## 2023-02-02 NOTE — Telephone Encounter (Signed)
Pt notified Urine culture is negative

## 2023-02-09 ENCOUNTER — Encounter: Payer: Self-pay | Admitting: Psychiatry

## 2023-02-09 ENCOUNTER — Ambulatory Visit: Payer: BC Managed Care – PPO | Admitting: Psychiatry

## 2023-02-09 VITALS — BP 118/80 | HR 76 | Temp 98.7°F | Ht 66.0 in | Wt 159.0 lb

## 2023-02-09 DIAGNOSIS — F411 Generalized anxiety disorder: Secondary | ICD-10-CM | POA: Diagnosis not present

## 2023-02-09 DIAGNOSIS — F39 Unspecified mood [affective] disorder: Secondary | ICD-10-CM

## 2023-02-09 DIAGNOSIS — Z79899 Other long term (current) drug therapy: Secondary | ICD-10-CM | POA: Insufficient documentation

## 2023-02-09 MED ORDER — PROPRANOLOL HCL 10 MG PO TABS: 10.0000 mg | ORAL_TABLET | Freq: Two times a day (BID) | ORAL | 1 refills | Status: DC | PRN

## 2023-02-09 MED ORDER — ESCITALOPRAM OXALATE 10 MG PO TABS: 10.0000 mg | ORAL_TABLET | Freq: Every day | ORAL | 1 refills | Status: DC

## 2023-02-09 NOTE — Progress Notes (Signed)
BH MD OP Progress Note  02/09/2023 11:15 AM Jordan Riley  MRN:  [id number]  Chief Complaint:  Chief Complaint  Patient presents with   Follow-up   Medication Refill   Anxiety   HPI: Jordan Riley is a 22 year old Caucasian female, employed, single, lives in [place], has a history of GAD, episodic mood disorder, insomnia was evaluated in office today.  Patient reports she decided to return to the office since she had worsening anxiety symptoms.  Patient reports after her appointment in June 2023 she was doing fairly well for a long time.  However she got back into a relationship, started dating someone whom she found on social media.  She reports she got into this intense emotional state when it came to this relationship to the point that all she was focused on was the relationship.  Patient reports however that did not last since the guy that she was dating decided to break it off.  Patient reports it came as a surprise to her since she was not expecting that.  Patient reports ever since then she has been questioning herself has had significant anxiety symptoms.  She describes anxiety as having chest tightness, discomfort and worrying.  She reports she has been trying to get back on track by working out again and focusing on her work and school.  She is currently at [employer] and is majoring in business.  She is planning to get her masters degree after this.  She continues to work at [employer] and has a responsible job which she enjoys.  She reports she is currently trying to eat healthier and get back on track with school and work.  Although that is getting better she continues to worry about her ' relationship patterns' and wonders whether something needs to be done.  She is currently not in therapy however is motivated to get back in therapy.  Patient reports during the time that she was struggling with a lot of anxiety she had started vaping and used CBD Gummies on and off.  She has stopped  doing all that since she does not want to develop those habits.  Patient currently denies any suicidality, homicidality or perceptual disturbances.  Patient reports sleep is overall good.  Patient denies any other concerns today.  Visit Diagnosis:    ICD-10-CM   1. GAD (generalized anxiety disorder)  F41.1 escitalopram (LEXAPRO) 10 MG tablet    propranolol (INDERAL) 10 MG tablet    TSH    2. Episodic mood disorder (HCC)  F39 escitalopram (LEXAPRO) 10 MG tablet    propranolol (INDERAL) 10 MG tablet    TSH    3. High risk medication use  Z79.899 TSH      Past Psychiatric History: I have reviewed past psychiatric history from progress note on 12/18/2018.  Past trials of Zoloft, Lexapro, hydroxyzine.  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  Family History:  Family History  Problem Relation Age of Onset   Bipolar disorder Mother    Heart disease Mother    Hypothyroidism Mother    Mental illness Sister    Mental illness Maternal Grandfather  Mental illness Maternal Grandmother     Social History: I have reviewed social history from progress note on 12/18/2018. Social History   Socioeconomic History   Marital status: Single    Spouse name: Not on file   Number of children: Not on file   Years of education: Not on file   Highest education level: Not on file  Occupational History   Not on file  Tobacco Use   Smoking status: Former    Types: E-cigarettes   Smokeless tobacco: Never  Vaping Use   Vaping status: Former   Substances: Nicotine  Substance and Sexual Activity   Alcohol use: Never   Drug use: Never   Sexual activity: Not Currently  Other Topics Concern    Not on file  Social History Narrative   Currently doing associates and employed full time   Social Determinants of Corporate investment banker Strain: Not on file  Food Insecurity: Not on file  Transportation Needs: Not on file  Physical Activity: Not on file  Stress: Not on file  Social Connections: Not on file    Allergies:  Allergies  Allergen Reactions   Shellfish Allergy Anaphylaxis   Cefdinir Nausea And Vomiting    Metabolic Disorder Labs: No results found for: "HGBA1C", "MPG" No results found for: "PROLACTIN" Lab Results  Component Value Date   CHOL 188 (H) 09/22/2019   TRIG 107 (H) 09/22/2019   HDL 51 09/22/2019   LDLCALC 118 (H) 09/22/2019   Lab Results  Component Value Date   TSH 1.850 01/29/2021   TSH 3.880 09/22/2019    Therapeutic Level Labs: No results found for: "LITHIUM" No results found for: "VALPROATE" No results found for: "CBMZ"  Current Medications: Current Outpatient Medications  Medication Sig Dispense Refill   clindamycin (CLINDAGEL) 1 % gel Apply to inner thighs QD PRN. 60 g 6   cyclobenzaprine (FLEXERIL) 5 MG tablet Take 1 tablet (5 mg total) by mouth at bedtime. 15 tablet 0   doxycycline (MONODOX) 100 MG capsule Take one cap po BID up to two weeks PRN flares. 60 capsule 6   fluticasone (FLONASE) 50 MCG/ACT nasal spray Place into the nose.     escitalopram (LEXAPRO) 10 MG tablet Take 1 tablet (10 mg total) by mouth daily. 30 tablet 1   propranolol (INDERAL) 10 MG tablet Take 1 tablet (10 mg total) by mouth 2 (two) times daily as needed. 60 tablet 1   No current facility-administered medications for this visit.     Musculoskeletal: Strength & Muscle Tone: within normal limits Gait & Station: normal Patient leans: N/A  Psychiatric Specialty Exam: Review of Systems  Psychiatric/Behavioral:  The patient is nervous/anxious.     Blood pressure 118/80, pulse 76, temperature 98.7 F (37.1 C), temperature source Temporal, height 5'  6" (1.676 m), weight 159 lb (72.1 kg), last menstrual period 01/02/2023, SpO2 99%.Body mass index is 25.66 kg/m.  General Appearance: Fairly Groomed  Eye Contact:  Fair  Speech:  Clear and Coherent  Volume:  Normal  Mood:  Anxious  Affect:  Congruent  Thought Process:  Goal Directed and Descriptions of Associations: Intact  Orientation:  Full (Time, Place, and Person)  Thought Content: Logical   Suicidal Thoughts:  No  Homicidal Thoughts:  No  Memory:  Immediate;   Fair Recent;   Fair Remote;   Fair  Judgement:  Fair  Insight:  Fair  Psychomotor Activity:  Normal  Concentration:  Concentration: Fair and Attention Span: Fair  Recall:  Fair  Fund of Knowledge: Fair  Language: Fair  Akathisia:  No  Handed:  Right  AIMS (if indicated): not done  Assets:  Desire for Improvement Housing Social Support Transportation Vocational/Educational  ADL's:  Intact  Cognition: WNL  Sleep:  Fair   Screenings: AIMS    Flowsheet Row Video Visit from 08/29/2021 in [facility]  AIMS Total Score 0      GAD-7    Flowsheet Row Office Visit from 02/09/2023 in [facility] Video Visit from 07/29/2021 in [facility] Office Visit from 04/24/2021 in [facility] Video Visit from 12/17/2020 in [facility]  Total GAD-7 Score 14 9 2 18       PHQ2-9    Flowsheet Row Office Visit from 02/09/2023 in [facility] Video Visit from 08/29/2021 in [facility] Video Visit from 07/29/2021 in [facility] Office Visit from 04/24/2021 in [facility] Office Visit from 02/04/2021 in [facility]  PHQ-2 Total Score 2 1 2 1  3   PHQ-9 Total Score 9 6 6  -- 12      Flowsheet Row Office Visit from 02/09/2023 in [facility] Video Visit from 08/29/2021 in [facility] Video Visit from 07/29/2021 in [facility]  C-SSRS RISK CATEGORY No Risk No Risk No Risk        Assessment and Plan: Alissha Shires is a 22 year old Caucasian female who lives in [place], employed, has a history of GAD, episodic mood disorder, was evaluated in office today, currently struggling with anxiety symptoms, possible cluster B traits, will benefit from following plan.  Plan GAD-unstable Restart Lexapro 10 mg p.o. daily Propranolol 10 mg p.o. twice daily as needed for severe anxiety Patient advised to reestablish care with therapist, patient to reach out to Ms. Felecia Jan.  Also provided other resources in the community.  Episodic mood disorder-improving Patient with recent situational/relationship struggles which affected her mood.  Currently improving. Restarted Lexapro as noted above. Patient to restart CBT, may benefit from DBT.  This was discussed in session today.  High risk medication use-patient will benefit from labs-TSH.  Provided lab slip.     Collaboration of Care: Collaboration of Care: Referral or follow-up with counselor/therapist AEB patient encouraged to establish care with therapist.  Patient/Guardian was advised Release of Information must be obtained prior to any record release in order to collaborate their care with an outside provider. Patient/Guardian was advised if they have not already done so to contact the registration department to sign all necessary forms in order for [place] to release information regarding their care.   Consent: Patient/Guardian gives verbal consent for treatment and assignment of benefits for services provided during this visit. Patient/Guardian expressed understanding and  agreed to proceed.   Follow-up in clinic in 8 weeks or sooner if needed.  This note was generated in part or whole with voice recognition software. Voice recognition is usually quite accurate but there are transcription errors that can and very often do occur. I apologize for any typographical errors that were not detected and corrected.    Jomarie Longs, MD 02/09/2023, 11:15 AM

## 2023-02-09 NOTE — Patient Instructions (Signed)
Call for therapy Allayne Butcher - [phone]

## 2023-02-19 ENCOUNTER — Ambulatory Visit
Admission: EM | Admit: 2023-02-19 | Discharge: 2023-02-19 | Disposition: A | Discharge status: Home / Self Care | Payer: BC Managed Care – PPO | Attending: Emergency Medicine | Admitting: Emergency Medicine

## 2023-02-19 ENCOUNTER — Encounter: Payer: Self-pay | Admitting: Emergency Medicine

## 2023-02-19 DIAGNOSIS — J069 Acute upper respiratory infection, unspecified: Secondary | ICD-10-CM | POA: Diagnosis present

## 2023-02-19 LAB — RESP PANEL BY RT-PCR (FLU A&B, COVID) ARPGX2
Influenza A by PCR: NEGATIVE
Influenza B by PCR: NEGATIVE
SARS Coronavirus 2 by RT PCR: NEGATIVE

## 2023-02-19 NOTE — Discharge Instructions (Addendum)
Check my chart for results. Most likely you have a viral illness. Rest,push fluids, May try using over the counter throat lozenges, mucinex or sudafed as label directed, hot tea, honey for cough.

## 2023-02-19 NOTE — ED Triage Notes (Signed)
Pt presents with a cough, SOB and congestion x 4 days.

## 2023-02-19 NOTE — ED Provider Notes (Signed)
[facility]    CSN: [id number] Arrival date & time: 02/19/23  0854      History   Chief Complaint Chief Complaint  Patient presents with   Cough   Shortness of Breath    HPI Jordan Riley is a 22 y.o. female.   22 year old female, Jordan Riley, presents to urgent care for evaluation of cough, shortness of breath x 4 days.  Patient's O2 sats are 100% on room air in office with respirations at 16 unlabored.  Medical history reported as depression, anxiety, atypical mole  The history is provided by the patient. No language interpreter was used.    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      Home Medications    Prior to Admission medications   Medication Sig Start Date End Date Taking? Authorizing Provider  clindamycin (CLINDAGEL) 1 % gel Apply to inner thighs QD PRN. 03/19/22  Yes Deirdre Evener, MD  doxycycline (MONODOX) 100 MG capsule Take one cap po BID up to two weeks PRN flares. 03/19/22  Yes Deirdre Evener, MD  escitalopram (LEXAPRO) 10 MG tablet Take 1 tablet (10 mg total) by mouth daily. 02/09/23  Yes Jomarie Longs, MD  fluticasone (FLONASE) 50 MCG/ACT nasal spray Place into the nose. 04/13/18  Yes [provider]  propranolol (INDERAL) 10 MG tablet Take 1 tablet (10 mg total) by mouth 2 (two) times daily as needed. 02/09/23  Yes Jomarie Longs, MD  cyclobenzaprine (FLEXERIL) 5 MG tablet Take 1  tablet (5 mg total) by mouth at bedtime. 01/29/23   McDonough, Salomon Fick, PA-C    Family History Family History  Problem Relation Age of Onset   Bipolar disorder Mother    Heart disease Mother    Hypothyroidism Mother    Mental illness Sister    Mental illness Maternal Grandfather    Mental illness Maternal Grandmother     Social History Social History   Tobacco Use   Smoking status: Former    Types: E-cigarettes   Smokeless tobacco: Never  Vaping Use   Vaping status: Former   Substances: Nicotine  Substance Use Topics   Alcohol use: Never   Drug use: Never     Allergies   Shellfish allergy and Cefdinir   Review of Systems Review of Systems  Constitutional:  Negative for fever.  HENT:  Positive for congestion.   Respiratory:  Positive for cough and shortness of breath.   All other systems reviewed and are negative.    Physical Exam Triage Vital Signs ED Triage Vitals  Encounter Vitals Group     BP 02/19/23 0944 107/74     Systolic BP Percentile --      Diastolic BP Percentile --      Pulse Rate 02/19/23 0944 73     Resp 02/19/23 0944 16     Temp 02/19/23 0944 98 F (  36.7 C)     Temp Source 02/19/23 0944 Oral     SpO2 02/19/23 0944 100 %     Weight --      Height --      Head Circumference --      Peak Flow --      Pain Score 02/19/23 0942 0     Pain Loc --      Pain Education --      Exclude from Growth Chart --    No data found.  Updated Vital Signs BP 107/74 (BP Location: Right Arm)   Pulse 73   Temp 98 F (36.7 C) (Oral)   Resp 16   LMP 02/02/2023 (Exact Date)   SpO2 100%   Visual Acuity Right Eye Distance:   Left Eye Distance:   Bilateral Distance:    Right Eye Near:   Left Eye Near:    Bilateral Near:     Physical Exam Vitals and nursing note reviewed.  Constitutional:      General: She is not in acute distress.    Appearance: She is well-developed and well-groomed.  HENT:     Head: Normocephalic and atraumatic.     Left  Ear: Tympanic membrane normal.     Nose: Nose normal.     Mouth/Throat:     Lips: Pink.     Mouth: Mucous membranes are moist.     Pharynx: Oropharynx is clear. Uvula midline.  Eyes:     Conjunctiva/sclera: Conjunctivae normal.  Cardiovascular:     Rate and Rhythm: Normal rate and regular rhythm.     Pulses: Normal pulses.     Heart sounds: Normal heart sounds. No murmur heard. Pulmonary:     Effort: Pulmonary effort is normal. No respiratory distress.     Breath sounds: Normal breath sounds and air entry.  Abdominal:     Palpations: Abdomen is soft.     Tenderness: There is no abdominal tenderness.  Musculoskeletal:        General: No swelling.     Cervical back: Neck supple.  Skin:    General: Skin is warm and dry.     Capillary Refill: Capillary refill takes less than 2 seconds.  Neurological:     General: No focal deficit present.     Mental Status: She is alert and oriented to person, place, and time.     GCS: GCS eye subscore is 4. GCS verbal subscore is 5. GCS motor subscore is 6.     Cranial Nerves: No cranial nerve deficit.     Sensory: No sensory deficit.  Psychiatric:        Mood and Affect: Mood normal.        Behavior: Behavior is cooperative.      UC Treatments / Results  Labs (all labs ordered are listed, but only abnormal results are displayed) Labs Reviewed  RESP PANEL BY RT-PCR (FLU A&B, COVID) [id number]    EKG   Radiology No results found.  Procedures Procedures (including critical care time)  Medications Ordered in UC Medications - No data to display  Initial Impression / Assessment and Plan / UC Course  I have reviewed the triage vital signs and the nursing notes.  Pertinent labs & imaging results that were available during my care of the patient were reviewed by me and considered in my medical decision making (see chart for details).    Discussed exam findings and plan of care with patient, strict go to ER precautions  given.   Patient  verbalized understanding to this provider.  Ddx: Viral URI w cough, allergies Final Clinical Impressions(s) / UC Diagnoses   Final diagnoses:  Viral URI with cough     Discharge Instructions      Check my chart for results. Most likely you have a viral illness. Rest,push fluids, May try using over the counter throat lozenges, mucinex or sudafed as label directed, hot tea, honey for cough.        ED Prescriptions   None    PDMP not reviewed this encounter.   Clancy Gourd, NP 02/19/23 2000

## 2023-03-30 ENCOUNTER — Other Ambulatory Visit: Payer: Self-pay | Admitting: Psychiatry

## 2023-03-30 DIAGNOSIS — F411 Generalized anxiety disorder: Secondary | ICD-10-CM

## 2023-03-30 DIAGNOSIS — F39 Unspecified mood [affective] disorder: Secondary | ICD-10-CM

## 2023-04-01 ENCOUNTER — Ambulatory Visit (INDEPENDENT_AMBULATORY_CARE_PROVIDER_SITE_OTHER): Payer: 59 | Admitting: Nurse Practitioner

## 2023-04-01 ENCOUNTER — Encounter: Payer: Self-pay | Admitting: Nurse Practitioner

## 2023-04-01 VITALS — BP 110/70 | HR 79 | Temp 98.6°F | Resp 16 | Ht 66.0 in | Wt 154.4 lb

## 2023-04-01 DIAGNOSIS — J0110 Acute frontal sinusitis, unspecified: Secondary | ICD-10-CM | POA: Diagnosis not present

## 2023-04-01 MED ORDER — AMOXICILLIN-POT CLAVULANATE 875-125 MG PO TABS: 1.0000 | ORAL_TABLET | Freq: Two times a day (BID) | ORAL | 0 refills | Status: AC

## 2023-04-01 NOTE — Progress Notes (Signed)
[facility] [address] [place], [place] [address]  Internal MEDICINE  Office Visit Note  Patient Name: Jordan Riley  [id number]  [id number]  Date of Service: 04/01/2023  Chief Complaint  Patient presents with   Acute Visit    Yesterday bad throat pain, headache, drainage, coughing(dark yellow mucus) chills. Covid test negative     HPI Jordan Riley presents for an acute sick visit for possible sinus infection.  --onset of symptoms was yesterday morning Reports sore throat, headache, nasal congestion, sinus drainage, cough, sinus pressure/pain, fever, chills, fatigue, body aches. Sputum is dark yellow color.  Denies any SOB, chest tightness, wheezing      Current Medication:  Outpatient Encounter Medications as of 04/01/2023  Medication Sig   amoxicillin-clavulanate (AUGMENTIN) 875-125 MG tablet Take 1 tablet by mouth 2 (two) times daily for 10 days. Take with food   clindamycin (CLINDAGEL) 1 % gel Apply to inner thighs QD PRN.   cyclobenzaprine (FLEXERIL) 5 MG tablet Take 1 tablet (5 mg total) by mouth at bedtime.   doxycycline (MONODOX) 100 MG capsule Take one cap po BID up to two weeks PRN flares.   escitalopram (LEXAPRO) 10 MG tablet TAKE 1 TABLET BY MOUTH EVERY DAY   fluticasone (FLONASE) 50 MCG/ACT nasal spray Place into the nose.   propranolol (INDERAL) 10 MG tablet Take 1 tablet (10 mg total) by mouth 2 (two) times daily as needed.   No facility-administered encounter medications on file as of 04/01/2023.      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  HENT:  Positive for congestion, postnasal drip,  rhinorrhea, sinus pressure, sinus pain, sneezing and sore throat. Negative for ear pain and trouble swallowing.   Respiratory:  Positive for cough. Negative for chest tightness, shortness of breath and wheezing.   Cardiovascular: Negative.  Negative for chest pain and palpitations.  Gastrointestinal:  Positive for nausea. Negative for abdominal pain, constipation, diarrhea and vomiting.  Musculoskeletal:  Positive for myalgias (body aches).  Neurological:  Positive for headaches.    Physical Exam Vitals reviewed.  Constitutional:      Appearance: Normal appearance. She is normal weight. She is ill-appearing.  HENT:     Head: Normocephalic and atraumatic.     Right Ear: Tympanic membrane, ear canal and external ear normal.     Left Ear: Tympanic membrane, ear canal and external ear normal.     Nose: Congestion and rhinorrhea present.     Mouth/Throat:     Mouth: Mucous membranes are moist.     Pharynx: Posterior oropharyngeal erythema present.  Eyes:     Pupils: Pupils are equal, round, and reactive to light.  Cardiovascular:     Rate and Rhythm: Normal rate and regular rhythm.     Heart sounds: Normal heart sounds. No murmur heard.    No friction rub. No gallop.  Pulmonary:     Effort: Pulmonary effort is normal. No accessory muscle usage or respiratory distress.     Breath sounds:  Normal breath sounds and air entry. No decreased breath sounds or wheezing.  Skin:    Capillary Refill: Capillary refill takes less than 2 seconds.  Neurological:     Mental Status: She is alert and oriented to person, place, and time.  Psychiatric:        Mood and Affect: Mood normal.        Behavior: Behavior normal.       Assessment/Plan: 1. Acute non-recurrent frontal sinusitis (Primary) Take augmentin as prescribed until gone. May use OTC medication to control cough and other symptoms as needed.  - amoxicillin-clavulanate (AUGMENTIN) 875-125 MG tablet; Take 1 tablet by mouth 2 (two) times  daily for 10 days. Take with food  Dispense: 20 tablet; Refill: 0   General Counseling: Jordan Riley verbalizes understanding of the findings of todays visit and agrees with plan of treatment. I have discussed any further diagnostic evaluation that may be needed or ordered today. We also reviewed her medications today. she has been encouraged to call the office with any questions or concerns that should arise related to todays visit.    Counseling:    No orders of the defined types were placed in this encounter.   Meds ordered this encounter  Medications   amoxicillin-clavulanate (AUGMENTIN) 875-125 MG tablet    Sig: Take 1 tablet by mouth 2 (two) times daily for 10 days. Take with food    Dispense:  20 tablet    Refill:  0    Return if symptoms worsen or fail to improve.  [place] Controlled Substance Database was reviewed by me for overdose risk score (ORS)  Time spent:20 Minutes Time spent with patient included reviewing progress notes, labs, imaging studies, and discussing plan for follow up.   This patient was seen by Sallyanne Kuster, FNP-C in collaboration with Dr. Beverely Risen as a part of collaborative care agreement.  Zandyr Barnhill R. Tedd Sias, MSN, FNP-C Internal Medicine

## 2023-04-03 ENCOUNTER — Telehealth (INDEPENDENT_AMBULATORY_CARE_PROVIDER_SITE_OTHER): Payer: 59 | Admitting: Psychiatry

## 2023-04-03 ENCOUNTER — Encounter: Payer: Self-pay | Admitting: Psychiatry

## 2023-04-03 DIAGNOSIS — F411 Generalized anxiety disorder: Secondary | ICD-10-CM

## 2023-04-03 DIAGNOSIS — F39 Unspecified mood [affective] disorder: Secondary | ICD-10-CM | POA: Diagnosis not present

## 2023-04-03 MED ORDER — ESCITALOPRAM OXALATE 5 MG PO TABS: 5.0000 mg | ORAL_TABLET | Freq: Every day | ORAL | 1 refills | Status: DC

## 2023-04-03 NOTE — Progress Notes (Signed)
Virtual Visit via Video Note  I connected with Jordan Riley on 04/03/23 at 11:00 AM EST by a video enabled telemedicine application and verified that I am speaking with the correct person using two identifiers.  Location Provider Location : [facility] Patient Location : Home  Participants: Patient , Provider   I discussed the limitations of evaluation and management by telemedicine and the availability of in person appointments. The patient expressed understanding and agreed to proceed. I discussed the assessment and treatment plan with the patient. The patient was provided an opportunity to ask questions and all were answered. The patient agreed with the plan and demonstrated an understanding of the instructions.   The patient was advised to call back or seek an in-person evaluation if the symptoms worsen or if the condition fails to improve as anticipated.   BH MD OP Progress Note  04/03/2023 11:30 AM Jordan Riley  MRN:  [id number]  Chief Complaint:  Chief Complaint  Patient presents with   Follow-up   Anxiety   Medication Refill   HPI: Jordan Riley is a 23 year old Caucasian female, single, lives in [place], has a history of GAD, episodic mood disorder, insomnia was 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  The patient with a history of generalized anxiety disorder and episodic mood swings, presents  anxiety due to current health issues , a recent sinus infection and a stomach bug. She is currently on day three of a course of antibiotics (Amoxicillin/Clavulanic acid) for the sinus infection. She reports feeling better since starting the antibiotics.  The patient also reports a recent episode of panic attack, which was unusual and more severe than her typical episodes. The attack was triggered by a sensation of throat tightness and loss of voice, which led to hyperventilation and a fear of having an allergic reaction. The episode lasted for approximately two hours. The patient has a known  allergy to shellfish, but did not consume any during the meal prior to the attack. She has not experienced similar symptoms in other panic attacks.  In terms of her anxiety disorder, the patient has been on Lexapro 10mg  and propranolol as needed. She reports a significant improvement in her day-to-day anxiety since restarting Lexapro, with a decrease in feelings of being overwhelmed. However, she still experiences occasional panic attacks. The patient is open to increasing the dose of Lexapro, as she still experiences severe panic attacks. She is scheduled to start cognitive behavioral therapy (CBT) and dialectical behavior therapy (DBT) with a therapist in March.  The patient also reports some issues with eating habits, with periods of eating well interspersed with episodes of binge eating when stressed. However, she does not consider this to be a significant problem.  Patient currently denies any suicidality, homicidality or perceptual disturbances.  Visit Diagnosis:    ICD-10-CM   1. GAD (generalized anxiety disorder)  F41.1 escitalopram (LEXAPRO) 5 MG tablet    2. Episodic mood disorder (HCC)  F39       Past Psychiatric History: I have reviewed past psychiatric history from progress note on 12/18/2018.  Past trials of Zoloft, hydroxyzine.  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  Family History:  Family  History  Problem Relation Age of Onset   Bipolar disorder Mother    Heart disease Mother    Hypothyroidism Mother    Mental illness Sister    Mental illness Maternal Grandfather    Mental illness Maternal Grandmother     Social History: I have reviewed social history from  progress note on 12/18/2018. Social History   Socioeconomic History   Marital status: Single    Spouse name: Not on file   Number of children: Not on file   Years of education: Not on file   Highest education level: Not on file  Occupational History   Not on file  Tobacco Use   Smoking status: Former    Types: E-cigarettes   Smokeless tobacco: Never  Vaping Use   Vaping status: Former   Substances: Nicotine  Substance and Sexual Activity   Alcohol use: Never   Drug use: Never   Sexual activity: Not Currently  Other Topics Concern   Not on file  Social History Narrative   Currently doing associates and employed full time   Social Drivers of Corporate investment banker Strain: Not on file  Food Insecurity: Not on file  Transportation Needs: Not on file  Physical Activity: Not on file  Stress: Not on file  Social Connections: Not on file    Allergies:  Allergies  Allergen Reactions   Shellfish Allergy Anaphylaxis   Cefdinir Nausea And Vomiting    Metabolic Disorder Labs: No results found for: "HGBA1C", "MPG" No results found for: "PROLACTIN" Lab Results  Component Value Date   CHOL 188 (H) 09/22/2019   TRIG 107 (H) 09/22/2019   HDL 51 09/22/2019   LDLCALC 118 (H) 09/22/2019   Lab Results  Component Value Date   TSH 1.850 01/29/2021   TSH 3.880 09/22/2019    Therapeutic Level Labs: No results found for: "LITHIUM" No results found for: "VALPROATE" No results found for: "CBMZ"  Current Medications: Current Outpatient Medications  Medication Sig Dispense Refill   escitalopram (LEXAPRO) 5 MG tablet Take 1 tablet (5 mg total) by mouth daily. Take daily with 10 mg , total of 15 mg daily 30 tablet 1   amoxicillin-clavulanate (AUGMENTIN) 875-125 MG tablet Take 1 tablet by mouth 2 (two) times daily for 10 days. Take with food 20 tablet 0   clindamycin (CLINDAGEL) 1 % gel Apply to inner thighs QD PRN. 60 g 6   cyclobenzaprine (FLEXERIL) 5 MG tablet Take 1  tablet (5 mg total) by mouth at bedtime. 15 tablet 0   doxycycline (MONODOX) 100 MG capsule Take one cap po BID up to two weeks PRN flares. 60 capsule 6   escitalopram (LEXAPRO) 10 MG tablet TAKE 1 TABLET BY MOUTH EVERY DAY 30 tablet 1   fluticasone (FLONASE) 50 MCG/ACT nasal spray Place into the nose.     propranolol (INDERAL) 10 MG tablet Take 1 tablet (10 mg total) by mouth 2 (two) times daily as needed. 60 tablet 1   No current facility-administered medications for this visit.     Musculoskeletal: Strength & Muscle Tone:  UTA Gait & Station:  Seated Patient leans: N/A  Psychiatric Specialty Exam: Review of Systems  Psychiatric/Behavioral:  Positive for sleep disturbance. The patient is nervous/anxious.     There were no vitals taken for this visit.There is no height or weight on file to calculate BMI.  General Appearance: Fairly Groomed  Eye Contact:  Fair  Speech:  Normal Rate  Volume:  Normal  Mood:  Anxious  Affect:  Congruent  Thought Process:  Goal Directed and Descriptions of Associations: Intact  Orientation:  Full (Time, Place, and Person)  Thought Content: Logical   Suicidal Thoughts:  No  Homicidal Thoughts:  No  Memory:  Immediate;   Fair Recent;   Fair Remote;   Fair  Judgement:  Fair  Insight:  Fair  Psychomotor Activity:  Normal  Concentration:  Concentration: Fair and Attention Span: Fair  Recall:  [employer] of Knowledge: Fair  Language: Fair  Akathisia:  No  Handed:  Right  AIMS (if indicated): not done  Assets:  Communication Skills Desire for Improvement Housing Social Support  ADL's:  Intact  Cognition: WNL  Sleep:   restless due to cough   Screenings: AIMS    Flowsheet Row Video Visit from 08/29/2021 in [facility]  AIMS Total Score 0      GAD-7    Flowsheet Row Office Visit from 02/09/2023 in [facility] Video Visit from 07/29/2021 in [facility] Office Visit from 04/24/2021 in [facility] Video Visit from 12/17/2020 in [facility]  Total GAD-7 Score 14 9 2 18       PHQ2-9    Flowsheet Row Office Visit from 02/09/2023 in [facility] Video Visit from 08/29/2021 in [facility] Video Visit from 07/29/2021 in [facility] Office Visit from 04/24/2021 in [facility] Office Visit from 02/04/2021 in [facility]  PHQ-2 Total Score 2 1 2 1 3   PHQ-9 Total Score 9 6 6  -- 12      Flowsheet Row Video Visit from 04/03/2023 in [facility] ED from 02/19/2023 in [facility] Visit from 02/09/2023 in [facility]  C-SSRS RISK CATEGORY No Risk No Risk No Risk        Assessment and Plan: Jordan Riley is a 23 year old female who lives in [place], employed, has a history of GAD, episodic mood symptoms, was 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  Patient with ongoing anxiety, discussed assessment and plan as noted below.  Generalized Anxiety Disorder with Panic Attacks-unstable Jordan Riley experiences significant panic attacks, particularly related to health anxiety, despite some benefit from Lexapro 10 mg and propranolol 10 mg as needed. Discussed increasing Lexapro to 15 mg for better symptom management and the importance of therapy.   - Increase Lexapro to 15 mg daily (10 mg + 5 mg)   - Send a 30-day supply with one refill of Lexapro 5 mg to the pharmacy   - Continue propranolol 10 mg as needed, with the option to double the dose if necessary   - Encourage earlier appointment with therapist Felecia Jan or consider new therapists at the  practice   - Schedule follow-up appointment in 7-8 weeks    Episodic mood disorder-improving Currently denies significant mood swings. - Patient referred for CBT - Lexapro increased as noted above.  Follow-up   - Schedule follow-up appointment for March 21 at 11 AM via video.   Collaboration of Care: Collaboration of Care: Referral or follow-up with counselor/therapist AEB patient encouraged to establish care with therapist.  Patient encouraged to follow up with primary care provider if her respiratory illness does not improve  Patient/Guardian was advised Release of  Information must be obtained prior to any record release in order to collaborate their care with an outside provider. Patient/Guardian was advised if they have not already done so to contact the registration department to sign all necessary forms in order for [place] to release information regarding their care.   Consent: Patient/Guardian gives verbal consent for treatment and assignment of benefits for services provided during this visit. Patient/Guardian expressed understanding and agreed to proceed.   This note was generated in part or whole with voice recognition software. Voice recognition is usually quite accurate but there are transcription errors that can and very often do occur. I apologize for any typographical errors that were not detected and corrected.    Jomarie Longs, MD 04/03/2023, 11:30 AM

## 2023-04-15 ENCOUNTER — Telehealth: Payer: Self-pay

## 2023-04-15 ENCOUNTER — Other Ambulatory Visit: Payer: Self-pay | Admitting: Internal Medicine

## 2023-04-15 ENCOUNTER — Other Ambulatory Visit: Payer: Self-pay

## 2023-04-15 DIAGNOSIS — R5081 Fever presenting with conditions classified elsewhere: Secondary | ICD-10-CM

## 2023-04-15 MED ORDER — OSELTAMIVIR PHOSPHATE 75 MG PO CAPS: 75.0000 mg | ORAL_CAPSULE | Freq: Two times a day (BID) | ORAL | 0 refills | Status: DC

## 2023-04-15 NOTE — Telephone Encounter (Signed)
Pt called back that she also have UTI symptoms  as per alyssa advised to try OTC AZO and cranberry juice drink plenty of water and if she not feeling call us back

## 2023-04-15 NOTE — Telephone Encounter (Signed)
Pt called back that she is positive for FLU A  as per dr Welton Flakes that advised that she is not feeling better call us back and also we send Tamiflu we order chest xray and also pt is not pregnant

## 2023-04-15 NOTE — Telephone Encounter (Signed)
Pt called that she woke up this morning had fever and sinusitis she want another around of antibiotic advised her to do Covid test and call us back with result

## 2023-04-19 ENCOUNTER — Other Ambulatory Visit: Payer: Self-pay | Admitting: Psychiatry

## 2023-04-19 DIAGNOSIS — F39 Unspecified mood [affective] disorder: Secondary | ICD-10-CM

## 2023-04-19 DIAGNOSIS — F411 Generalized anxiety disorder: Secondary | ICD-10-CM

## 2023-05-27 ENCOUNTER — Other Ambulatory Visit: Payer: Self-pay | Admitting: Psychiatry

## 2023-05-27 DIAGNOSIS — F411 Generalized anxiety disorder: Secondary | ICD-10-CM

## 2023-06-05 ENCOUNTER — Telehealth: Payer: Self-pay | Admitting: Psychiatry

## 2023-06-12 ENCOUNTER — Telehealth: Payer: Self-pay

## 2023-06-12 NOTE — Telephone Encounter (Signed)
 Per Lauren patient should go to Urgent Care to checked out. Patient was notified.

## 2023-06-13 ENCOUNTER — Ambulatory Visit
Admission: EM | Admit: 2023-06-13 | Discharge: 2023-06-13 | Disposition: A | Discharge status: Home / Self Care | Attending: Physician Assistant | Admitting: Physician Assistant

## 2023-06-13 ENCOUNTER — Encounter: Payer: Self-pay | Admitting: Physician Assistant

## 2023-06-13 ENCOUNTER — Encounter: Payer: Self-pay | Admitting: Emergency Medicine

## 2023-06-13 DIAGNOSIS — J069 Acute upper respiratory infection, unspecified: Secondary | ICD-10-CM | POA: Diagnosis present

## 2023-06-13 DIAGNOSIS — J029 Acute pharyngitis, unspecified: Secondary | ICD-10-CM | POA: Diagnosis present

## 2023-06-13 DIAGNOSIS — R051 Acute cough: Secondary | ICD-10-CM | POA: Diagnosis present

## 2023-06-13 LAB — GROUP A STREP BY PCR: Group A Strep by PCR: NOT DETECTED

## 2023-06-13 MED ORDER — IPRATROPIUM BROMIDE 0.06 % NA SOLN: 2.0000 | Freq: Four times a day (QID) | NASAL | 0 refills | Status: DC

## 2023-06-13 MED ORDER — PROMETHAZINE-DM 6.25-15 MG/5ML PO SYRP: 5.0000 mL | ORAL_SOLUTION | Freq: Four times a day (QID) | ORAL | 0 refills | Status: DC | PRN

## 2023-06-13 MED ORDER — LIDOCAINE VISCOUS HCL 2 % MT SOLN: 15.0000 mL | OROMUCOSAL | 0 refills | Status: AC | PRN

## 2023-06-13 NOTE — ED Triage Notes (Signed)
 Patient c/o cough, congestion and chest congestion that started on Monday.  Patient denies fevers

## 2023-06-13 NOTE — ED Provider Notes (Signed)
 [facility]    CSN: [id number] Arrival date & time: 06/13/23  1223      History   Chief Complaint Chief Complaint  Patient presents with   Cough    HPI Jordan Riley is a 23 y.o. female presenting today for 5 day history of occasionally productive cough, congestion, runny nose, shortness of breath, sore throat, and fatigue.   Denies fever, body aches, sinus pain, ear pain, chest pain, wheezing, nausea or vomiting.   History of influenza 2 months ago. No recent sick contacts.   No history of asthma or other pulmonary disease.   Not currently taking any OTC meds.    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      Home Medications    Prior to Admission medications   Medication Sig Start Date End Date Taking? Authorizing Provider  ipratropium (ATROVENT) 0.06 % nasal spray Place 2 sprays into both nostrils 4 (four) times daily. 06/13/23  Yes Eusebio Friendly B, PA-C  lidocaine (XYLOCAINE) 2 % solution Use as directed 15 mLs in the mouth or throat every 3 (three) hours as needed for mouth pain (swish and spit). 06/13/23  Yes Shirlee Latch, PA-C  promethazine-dextromethorphan (PROMETHAZINE-DM) 6.25-15 MG/5ML syrup Take 5 mLs by mouth 4 (four) times daily as needed. 06/13/23  Yes Shirlee Latch, PA-C  clindamycin (CLINDAGEL) 1 % gel Apply to inner thighs QD PRN. 03/19/22   Deirdre Evener, MD  cyclobenzaprine (FLEXERIL)  5 MG tablet Take 1 tablet (5 mg total) by mouth at bedtime. 01/29/23   McDonough, Lauren K, PA-C  escitalopram (LEXAPRO) 10 MG tablet TAKE 1 TABLET BY MOUTH EVERY DAY 03/30/23   Eappen, Levin Bacon, MD  escitalopram (LEXAPRO) 5 MG tablet TAKE 1 TABLET (5 MG TOTAL) BY MOUTH DAILY. TAKE DAILY WITH 10 MG , TOTAL OF 15 MG DAILY 05/27/23   Jomarie Longs, MD  fluticasone (FLONASE) 50 MCG/ACT nasal spray Place into the nose. 04/13/18   [provider]  propranolol (INDERAL) 10 MG tablet TAKE 1 TABLET BY MOUTH 2 TIMES DAILY AS NEEDED. 04/20/23   Jomarie Longs, MD    Family History Family History  Problem Relation Age of Onset   Bipolar disorder Mother    Heart disease Mother    Hypothyroidism Mother    Mental illness Sister    Mental illness Maternal Grandfather    Mental illness Maternal Grandmother     Social History Social History   Tobacco Use   Smoking status: Former    Types: E-cigarettes   Smokeless tobacco: Never  Vaping Use   Vaping status: Former   Substances: Nicotine  Substance Use Topics   Alcohol use: Never   Drug use: Never     Allergies   Shellfish allergy and Cefdinir   Review of Systems Review of Systems  Constitutional:  Positive for fatigue. Negative for chills,  diaphoresis and fever.  HENT:  Positive for congestion, rhinorrhea and sore throat. Negative for ear pain, sinus pressure and sinus pain.   Respiratory:  Positive for cough and shortness of breath. Negative for wheezing.   Cardiovascular:  Negative for chest pain.  Gastrointestinal:  Negative for abdominal pain, nausea and vomiting.  Musculoskeletal:  Negative for arthralgias and myalgias.  Skin:  Negative for rash.  Neurological:  Negative for weakness and headaches.  Hematological:  Negative for adenopathy.     Physical Exam Triage Vital Signs ED Triage Vitals  Encounter Vitals Group     BP      Systolic BP Percentile      Diastolic BP Percentile      Pulse      Resp      Temp       Temp src      SpO2      Weight      Height      Head Circumference      Peak Flow      Pain Score      Pain Loc      Pain Education      Exclude from Growth Chart    No data found.  Updated Vital Signs BP 117/76 (BP Location: Right Arm)   Pulse 81   Temp 98.4 F (36.9 C) (Oral)   Resp 14   Ht 5\' 6"  (1.676 m)   Wt 154 lb 5.2 oz (70 kg)   LMP 05/30/2023 (Approximate)   SpO2 98%   BMI 24.91 kg/m       Physical Exam Vitals and nursing note reviewed.  Constitutional:      General: She is not in acute distress.    Appearance: Normal appearance. She is not ill-appearing or toxic-appearing.  HENT:     Head: Normocephalic and atraumatic.     Right Ear: Tympanic membrane, ear canal and external ear normal.     Left Ear: Tympanic membrane, ear canal and external ear normal.     Nose: Congestion present.     Mouth/Throat:     Mouth: Mucous membranes are moist.     Pharynx: Oropharynx is clear. Posterior oropharyngeal erythema present.  Eyes:     General: No scleral icterus.       Right eye: No discharge.        Left eye: No discharge.     Conjunctiva/sclera: Conjunctivae normal.  Cardiovascular:     Rate and Rhythm: Normal rate and regular rhythm.     Heart sounds: Normal heart sounds.  Pulmonary:     Effort: Pulmonary effort is normal. No respiratory distress.     Breath sounds: Normal breath sounds.  Musculoskeletal:     Cervical back: Neck supple.  Skin:    General: Skin is dry.  Neurological:     General: No focal deficit present.     Mental Status: She is alert. Mental status is at baseline.     Motor: No weakness.     Gait: Gait normal.  Psychiatric:        Mood and Affect: Mood normal.        Behavior: Behavior normal.      UC Treatments / Results  Labs (all labs ordered are listed, but only abnormal results are displayed) Labs Reviewed  GROUP A STREP BY PCR    EKG   Radiology No results found.  Procedures Procedures (including critical  care time)  Medications Ordered in UC Medications -  No data to display  Initial Impression / Assessment and Plan / UC Course  I have reviewed the triage vital signs and the nursing notes.  Pertinent labs & imaging results that were available during my care of the patient were reviewed by me and considered in my medical decision making (see chart for details).   23 y/o female presents for 5 day history of productive cough, congestion, sore throat, SOB, and fatigue. No fever.   Patient is afebrile and overall well appearing. NAD. On exam, has nasal congestion. Throat with mild erythema. Chest is clear. Heart RRR.   PCR strep test obtained.  Viral URI. Supportive care discussed. Sent promethazine DM, Atrovent nasal spray, viscous lidocaine. Reviewed returning for fever, worsening cough or increased SOB.  Negative strep.   Final Clinical Impressions(s) / UC Diagnoses   Final diagnoses:  Viral upper respiratory tract infection  Acute cough  Sore throat     Discharge Instructions      URI/COLD SYMPTOMS: Your exam today is consistent with a viral illness. Antibiotics are not indicated at this time. Use medications as directed, including cough syrup, nasal saline, and decongestants. Your symptoms should improve over the next few days and resolve within 7-10 days. Increase rest and fluids. F/u if symptoms worsen or predominate such as sore throat, ear pain, productive cough, shortness of breath, or if you develop high fevers or worsening fatigue over the next several days.    -I will call you if strep is positive and send antibiotic.     ED Prescriptions     Medication Sig Dispense Auth. Provider   promethazine-dextromethorphan (PROMETHAZINE-DM) 6.25-15 MG/5ML syrup Take 5 mLs by mouth 4 (four) times daily as needed. 118 mL Eusebio Friendly B, PA-C   ipratropium (ATROVENT) 0.06 % nasal spray Place 2 sprays into both nostrils 4 (four) times daily. 15 mL Eusebio Friendly B, PA-C    lidocaine (XYLOCAINE) 2 % solution Use as directed 15 mLs in the mouth or throat every 3 (three) hours as needed for mouth pain (swish and spit). 100 mL Shirlee Latch, PA-C      PDMP not reviewed this encounter.   Shirlee Latch, PA-C 06/13/23 1321

## 2023-06-13 NOTE — Discharge Instructions (Signed)
 URI/COLD SYMPTOMS: Your exam today is consistent with a viral illness. Antibiotics are not indicated at this time. Use medications as directed, including cough syrup, nasal saline, and decongestants. Your symptoms should improve over the next few days and resolve within 7-10 days. Increase rest and fluids. F/u if symptoms worsen or predominate such as sore throat, ear pain, productive cough, shortness of breath, or if you develop high fevers or worsening fatigue over the next several days.    -I will call you if strep is positive and send antibiotic.

## 2023-07-10 ENCOUNTER — Telehealth (INDEPENDENT_AMBULATORY_CARE_PROVIDER_SITE_OTHER): Payer: Self-pay | Admitting: Psychiatry

## 2023-07-10 ENCOUNTER — Encounter: Payer: Self-pay | Admitting: Psychiatry

## 2023-07-10 DIAGNOSIS — F39 Unspecified mood [affective] disorder: Secondary | ICD-10-CM

## 2023-07-10 DIAGNOSIS — F411 Generalized anxiety disorder: Secondary | ICD-10-CM

## 2023-07-10 MED ORDER — ESCITALOPRAM OXALATE 10 MG PO TABS: 10.0000 mg | ORAL_TABLET | Freq: Every day | ORAL | 5 refills | Status: DC

## 2023-07-10 MED ORDER — ESCITALOPRAM OXALATE 5 MG PO TABS: 5.0000 mg | ORAL_TABLET | Freq: Every day | ORAL | 5 refills | Status: DC

## 2023-07-10 NOTE — Progress Notes (Signed)
 Virtual Visit via Video Note  I connected with Jordan Riley on 07/10/23 at  9:40 AM EDT by a video enabled telemedicine application and verified that I am speaking with the correct person using two identifiers.  Location Provider Location : [facility] Patient Location : Home  Participants: Patient , Provider   I discussed the limitations of evaluation and management by telemedicine and the availability of in person appointments. The patient expressed understanding and agreed to proceed.   I discussed the assessment and treatment plan with the patient. The patient was provided an opportunity to ask questions and all were answered. The patient agreed with the plan and demonstrated an understanding of the instructions.   The patient was advised to call back or seek an in-person evaluation if the symptoms worsen or if the condition fails to improve as anticipated.    BH MD OP Progress Note  07/10/2023 10:04 AM Jordan Riley  MRN:  [id number]  Chief Complaint:  Chief Complaint  Patient presents with   Anxiety   Follow-up   Medication Refill   Discussed the use of AI scribe software for clinical note transcription with the patient, who gave verbal consent to proceed.  History of Present Illness Jordan Riley is a 23 year old Caucasian female, employed, single, lives in [place], has a history of GAD, episodic mood disorder, insomnia was 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    Since her last visit in January, she has experienced mixed progress with her mental health. Anxiety remains a significant issue, particularly related to dating and work tasks. She has taken a break from dating to focus on herself, which has helped reduce anxiety triggers.  She is actively searching for a new therapist after an insurance change to [employer]. Her previous therapist was beneficial in understanding her attachment styles and behavior patterns.  She is currently taking Lexapro  15 mg, which she feels is effective in  managing her anxiety, though she experiences occasional moments of anxiety. Propranolol  is used as needed for acute anxiety episodes, which she finds helpful.  She reports improved sleep and functioning, stating that her sleep schedule is back on track, allowing her to concentrate better on work and school.  She mentions a past incident where she lost her voice, unsure if it was a panic attack or a reaction to food, but it has not recurred.  She has also experienced pelvic pain, for which she visited her OBGYN and the ER, but no definitive diagnosis was made.  She denies any suicidality, homicidality or perceptual disturbances.   Visit Diagnosis:    ICD-10-CM   1. GAD (generalized anxiety disorder)  F41.1 escitalopram  (LEXAPRO ) 10 MG tablet    escitalopram  (LEXAPRO ) 5 MG tablet    2. Episodic mood disorder (HCC)  F39 escitalopram  (LEXAPRO ) 10 MG tablet      Past Psychiatric History: I have reviewed past psychiatric history from progress note on 12/18/2018.  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  Family History:  Family History  Problem Relation Age of Onset   Bipolar disorder Mother    Heart disease Mother    Hypothyroidism Mother  Mental illness Sister    Mental illness Maternal Grandfather    Mental illness Maternal Grandmother     Social History: I have reviewed social history from progress note on 12/18/2018. Social History   Socioeconomic History   Marital status: Single    Spouse name: Not on file   Number of children: Not on file   Years of education: Not on file   Highest education level: Not on file  Occupational History    Not on file  Tobacco Use   Smoking status: Former    Types: E-cigarettes   Smokeless tobacco: Never  Vaping Use   Vaping status: Former   Substances: Nicotine  Substance and Sexual Activity   Alcohol use: Never   Drug use: Never   Sexual activity: Not Currently  Other Topics Concern   Not on file  Social History Narrative   Currently doing associates and employed full time   Social Drivers of Corporate investment banker Strain: Not on file  Food Insecurity: Not on file  Transportation Needs: Not on file  Physical Activity: Not on file  Stress: Not on file  Social Connections: Not on file    Allergies:  Allergies  Allergen Reactions   Shellfish Allergy Anaphylaxis   Cefdinir Nausea And Vomiting    Metabolic Disorder Labs: No results found for: "HGBA1C", "MPG" No results found for: "PROLACTIN" Lab Results  Component Value Date   CHOL 188 (H) 09/22/2019   TRIG 107 (H) 09/22/2019   HDL 51 09/22/2019   LDLCALC 118 (H) 09/22/2019   Lab Results  Component Value Date   TSH 1.850 01/29/2021   TSH 3.880 09/22/2019    Therapeutic Level Labs: No results found for: "LITHIUM" No results found for: "VALPROATE" No results found for: "CBMZ"  Current Medications: Current Outpatient Medications  Medication Sig Dispense Refill   fluticasone (FLONASE) 50 MCG/ACT nasal spray Place into the nose.     ipratropium (ATROVENT ) 0.06 % nasal spray Place 2 sprays into both nostrils 4 (four) times daily. 15 mL 0   lidocaine  (XYLOCAINE ) 2 % solution Use as directed 15 mLs in the mouth or throat every 3 (three) hours as needed for mouth pain (swish and spit). 100 mL 0   propranolol  (INDERAL ) 10 MG tablet TAKE 1 TABLET BY MOUTH 2 TIMES DAILY AS NEEDED. 60 tablet 1   clindamycin  (CLINDAGEL) 1 % gel Apply to inner thighs QD PRN. 60 g 6   escitalopram  (LEXAPRO ) 10 MG tablet Take 1 tablet (10 mg total) by mouth daily. 30 tablet 5   escitalopram  (LEXAPRO ) 5 MG tablet Take 1 tablet (5 mg  total) by mouth daily. Take daily with 10 mg , total of 15 mg daily 30 tablet 5   No current facility-administered medications for this visit.     Musculoskeletal: Strength & Muscle Tone:  UTA Gait & Station:  Seated Patient leans: N/A  Psychiatric Specialty Exam: Review of Systems  Psychiatric/Behavioral:  The patient is nervous/anxious.     Last menstrual period 05/30/2023.There is no height or weight on file to calculate BMI.  General Appearance: Casual  Eye Contact:  Fair  Speech:  Normal Rate  Volume:  Normal  Mood:  Anxious  Affect:  Congruent  Thought Process:  Goal Directed and Descriptions of Associations: Intact  Orientation:  Full (Time, Place, and Person)  Thought Content: Logical   Suicidal Thoughts:  No  Homicidal Thoughts:  No  Memory:  Immediate;   Fair Recent;  Fair Remote;   Fair  Judgement:  Fair  Insight:  Fair  Psychomotor Activity:  Normal  Concentration:  Concentration: Fair and Attention Span: Fair  Recall:  [employer] of Knowledge: Fair  Language: Fair  Akathisia:  No  Handed:  Right  AIMS (if indicated): not done  Assets:  Desire for Improvement Housing Social Support Talents/Skills Transportation  ADL's:  Intact  Cognition: WNL  Sleep:  Fair   Screenings: AIMS    Flowsheet Row Video Visit from 08/29/2021 in [facility]  AIMS Total Score 0      GAD-7    Flowsheet Row Office Visit from 02/09/2023 in [facility] Video Visit from 07/29/2021 in [facility] Office Visit from 04/24/2021 in [facility] Video Visit from 12/17/2020 in [facility]  Total GAD-7 Score 14 9 2 18       PHQ2-9    Flowsheet Row Office Visit from 02/09/2023 in [facility] Video Visit from 08/29/2021 in [facility] Video Visit from 07/29/2021 in [facility] Office Visit from 04/24/2021 in [facility] Office Visit from 02/04/2021 in [facility]  PHQ-2 Total Score 2 1 2 1 3   PHQ-9 Total Score 9 6 6  -- 12      Flowsheet Row Video Visit from 07/10/2023 in [facility] ED from 06/13/2023 in [facility]  Video Visit from 04/03/2023 in [facility]  C-SSRS RISK CATEGORY No Risk No Risk No Risk        Assessment and Plan: Davy Faught is a 23 year old female, lives in [place], employed, has a history of GAD, episodic mood symptoms was 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  Discussed assessment and plan as noted below.   Assessment & Plan Generalized anxiety disorder-improving Episodic mood disorder-improving Generalized anxiety disorder with mood swings. Lexapro  increased to 15 mg in January, reducing background anxiety. Anxiety persists with relationship and job-related triggers, but propranolol  is effective for acute episodes. Sleep and overall functioning have improved. She is actively seeking a new therapist through [facility] to address anxiety triggers more effectively. - Continue Lexapro  15 mg daily. - Continue Propranolol  10 mg as needed for acute anxiety episodes. - Encourage finding a new therapist through [facility].  Follow-up Follow-up in clinic in 3 months or sooner if needed.   Collaboration of Care: Collaboration of Care: Referral or follow-up with counselor/therapist AEB to schedule an appointment with therapist.  Patient/Guardian was advised Release of Information must be obtained prior to any record release in order to collaborate their care with an outside provider. Patient/Guardian was advised if they have not already done so to  contact the registration department to sign all necessary forms in order for us  to release information regarding their care.   Consent: Patient/Guardian gives verbal consent for treatment and assignment of benefits for services provided during this visit. Patient/Guardian expressed understanding and agreed to proceed.  This note was generated in part or whole with voice recognition software. Voice recognition is usually quite accurate but there are transcription errors that can and very often do occur. I apologize for any typographical errors that were not detected and corrected.     Rowland Ericsson, MD 07/10/2023, 10:04 AM

## 2023-07-14 ENCOUNTER — Telehealth: Payer: Self-pay | Admitting: Physician Assistant

## 2023-07-14 NOTE — Telephone Encounter (Signed)
 Left vm and sent mychart message to confirm 07/20/23 appointment-[name]

## 2023-07-20 ENCOUNTER — Encounter: Payer: 59 | Admitting: Physician Assistant

## 2023-10-04 ENCOUNTER — Other Ambulatory Visit: Payer: Self-pay | Admitting: Psychiatry

## 2023-10-04 DIAGNOSIS — F39 Unspecified mood [affective] disorder: Secondary | ICD-10-CM

## 2023-10-04 DIAGNOSIS — F411 Generalized anxiety disorder: Secondary | ICD-10-CM

## 2023-10-09 ENCOUNTER — Telehealth: Payer: Self-pay | Admitting: Psychiatry

## 2023-10-16 ENCOUNTER — Telehealth: Payer: Self-pay | Admitting: Psychiatry

## 2023-10-16 ENCOUNTER — Encounter: Payer: Self-pay | Admitting: Psychiatry

## 2023-10-16 DIAGNOSIS — F411 Generalized anxiety disorder: Secondary | ICD-10-CM | POA: Diagnosis not present

## 2023-10-16 DIAGNOSIS — F39 Unspecified mood [affective] disorder: Secondary | ICD-10-CM | POA: Diagnosis not present

## 2023-10-16 NOTE — Progress Notes (Signed)
 Virtual Visit via Video Note  I connected with Jordan Riley on 10/16/23 at 10:40 AM EDT by a video enabled telemedicine application and verified that I am speaking with the correct person using two identifiers.  Location Provider Location : [facility] Patient Location : Home  Participants: Patient , Provider   I discussed the limitations of evaluation and management by telemedicine and the availability of in person appointments. The patient expressed understanding and agreed to proceed.   I discussed the assessment and treatment plan with the patient. The patient was provided an opportunity to ask questions and all were answered. The patient agreed with the plan and demonstrated an understanding of the instructions.   The patient was advised to call back or seek an in-person evaluation if the symptoms worsen or if the condition fails to improve as anticipated.  BH MD OP Progress Note  10/16/2023 11:26 AM Karrie Fluellen  MRN:  [id number]  Chief Complaint:  Chief Complaint  Patient presents with   Follow-up   Anxiety   Depression   Medication Refill   Discussed the use of AI scribe software for clinical note transcription with the patient, who gave verbal consent to proceed.  History of Present Illness Jordan Riley is a 23 year old Caucasian female, employed, single, lives in [place], has a history of GAD, episodic mood disorder, insomnia was 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  She reports that her mood and anxiety have been very good recently. She connects improved emotional regulation to her current medication regimen of 15 mg Lexapro  daily, which has contributed to this stability. In her current romantic relationship, she feels more secure and less anxious, noting that previous relationships and interactions with others contributed to her anxiety, insecurity, and anxious attachment, which she links to abandonment issues related to her father. She describes her current partner as supportive  and aware of her ongoing efforts to address these issues, and says he has helped her experience a significant reduction in anxiety and internal conflict related to relationships.  A recent acute stress episode occurred after she discovered mold in her home and learned about potential job instability due to federal funding cuts. She describes feeling overwhelmed and experiencing a panic attack last night, which she managed by taking propranolol  as needed. After taking the medication, she was able to calm herself and make clear decisions. She identifies these stressors as valid triggers for her anxiety and notes that such episodes are infrequent and typically related to significant situational stressors.  She denies suicidal thoughts and thoughts of harming others.  She previously engaged in therapy focused on understanding the origins of her behaviors and working on issues related to anxious attachment and abandonment stemming from her relationship with her father.  She is currently employed in Architect at [employer] and recently won an award for [employer] at work. She reports potential job instability due to budget cuts.   Visit Diagnosis:    ICD-10-CM   1. GAD (generalized anxiety disorder)  F41.1     2. Episodic mood disorder (HCC)  F39       Past Psychiatric History: Reviewed past psychiatric history from progress note on 12/18/2018.  Past trials of Zoloft , hydroxyzine .  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  Family History:  Family History  Problem Relation Age of Onset   Bipolar disorder Mother    Heart disease Mother    Hypothyroidism Mother    Mental illness  Sister    Mental illness Maternal Grandfather    Mental illness Maternal Grandmother     Social History: I have reviewed social history from progress note on 12/18/2018. Social History   Socioeconomic History   Marital status: Single    Spouse name: Not on file   Number of children: Not on file   Years of education: Not on file   Highest education level: Not on file  Occupational History   Not on file  Tobacco Use   Smoking status: Former    Types: E-cigarettes   Smokeless tobacco: Never  Vaping Use   Vaping status: Former   Substances: Nicotine  Substance and Sexual Activity   Alcohol use: Never   Drug use: Never   Sexual activity: Not Currently  Other Topics Concern   Not on file  Social History Narrative   Currently doing associates and employed full time   Social Drivers of Corporate investment banker Strain: Not on file  Food Insecurity: Not on file  Transportation Needs: Not on file  Physical Activity: Not on file  Stress: Not on file  Social Connections: Not on file    Allergies:  Allergies  Allergen Reactions   Shellfish Allergy Anaphylaxis   Cefdinir Nausea And Vomiting    Metabolic Disorder Labs: No results found for: HGBA1C, MPG No results found for: PROLACTIN Lab Results  Component Value Date   CHOL 188 (H) 09/22/2019   TRIG 107 (H) 09/22/2019   HDL 51 09/22/2019   LDLCALC 118 (H) 09/22/2019   Lab Results  Component Value Date   TSH 1.850 01/29/2021   TSH 3.880 09/22/2019    Therapeutic Level Labs: No results found for: LITHIUM No results found for: VALPROATE No results found for: CBMZ  Current Medications: Current Outpatient Medications  Medication Sig Dispense Refill   SYEDA 3-0.03 MG tablet Take 1 tablet by mouth daily.     clindamycin  (CLINDAGEL) 1 % gel Apply to inner thighs QD PRN. 60 g 6   escitalopram  (LEXAPRO ) 10 MG tablet Take 1 tablet (10 mg total) by mouth daily. 30 tablet 5   escitalopram  (LEXAPRO ) 5 MG  tablet Take 1 tablet (5 mg total) by mouth daily. Take daily with 10 mg , total of 15 mg daily 30 tablet 5   fluticasone (FLONASE) 50 MCG/ACT nasal spray Place into the nose.     ipratropium (ATROVENT ) 0.06 % nasal spray Place 2 sprays into both nostrils 4 (four) times daily. 15 mL 0   lidocaine  (XYLOCAINE ) 2 % solution Use as directed 15 mLs in the mouth or throat every 3 (three) hours as needed for mouth pain (swish and spit). 100 mL 0   propranolol  (INDERAL ) 10 MG tablet TAKE 1 TABLET BY MOUTH 2 TIMES DAILY AS NEEDED. 60 tablet 1   No current facility-administered medications for this visit.     Musculoskeletal: Strength & Muscle Tone: UTA Gait & Station: Seated Patient leans: N/A  Psychiatric Specialty Exam: Review of Systems  Psychiatric/Behavioral:  The patient is nervous/anxious.     There were no vitals taken for this visit.There is no height or weight on file to calculate BMI.  General Appearance: Casual  Eye Contact:  Fair  Speech:  Clear and Coherent  Volume:  Normal  Mood:  Anxious situational, currently coping well  Affect:  Congruent  Thought Process:  Goal Directed and Descriptions of Associations: Intact  Orientation:  Full (Time, Place, and Person)  Thought Content: Logical   Suicidal Thoughts:  No  Homicidal Thoughts:  No  Memory:  Immediate;   Fair Recent;   Fair Remote;   Fair  Judgement:  Fair  Insight:  Fair  Psychomotor Activity:  Normal  Concentration:  Concentration: Fair and Attention Span: Fair  Recall:  [employer] of Knowledge: Fair  Language: Fair  Akathisia:  No  Handed:  Right  AIMS (if indicated): not done  Assets:  Communication Skills Desire for Improvement Housing Social Support Transportation  ADL's:  Intact  Cognition: WNL  Sleep:  Fair   Screenings: AIMS    Flowsheet Row Video Visit from 08/29/2021 in [facility]  AIMS Total Score 0   GAD-7    Flowsheet Row Office Visit from  02/09/2023 in [facility] Video Visit from 07/29/2021 in [facility] Office Visit from 04/24/2021 in [facility] Video Visit from 12/17/2020 in [facility]  Total GAD-7 Score 14 9 2 18    PHQ2-9    Flowsheet Row Office Visit from 02/09/2023 in [facility] Video Visit from 08/29/2021 in [facility] Video Visit from 07/29/2021 in [facility] Office Visit from 04/24/2021 in [facility] Office Visit from 02/04/2021 in [facility]  PHQ-2 Total Score 2 1 2 1 3   PHQ-9 Total Score 9 6 6  -- 12   Flowsheet Row Video Visit from 10/16/2023 in [facility] Video Visit from 07/10/2023 in [facility] UC from 06/13/2023 in [facility] at [place]   C-SSRS RISK CATEGORY No Risk No Risk No Risk     Assessment and Plan: Jordan Riley is a 23 year old female, lives in [place], has a history of GAD, episodic mood disorder was 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  Discussed assessment and plan as noted below.  Generalized anxiety disorder-stable Episodic mood disorder-stable Currently with situational anxiety triggered by job instability, housing issue although she currently is in a good relationship with her boyfriend and has moved in with him which has helped.  Overall managing anxiety on the Lexapro  current dosage. Lexapro  15 mg daily Propranolol  10 mg as needed for severe anxiety attacks Encouraged to establish care with the therapist if anxiety worsens.   Follow-up Follow-up in clinic in 2 months or sooner if needed.   Collaboration of Care: Collaboration of Care:  Referral or follow-up with counselor/therapist AEB encouraged to establish care with therapist.  Patient/Guardian was advised Release of Information must be obtained prior to any record release in order to collaborate their care with an outside provider. Patient/Guardian was advised if they have not already done so to contact the registration department to sign all necessary forms in order for us  to release information regarding their care.   Consent: Patient/Guardian gives verbal consent for treatment and assignment of benefits for services provided during this visit. Patient/Guardian expressed understanding and agreed to proceed.   This note was generated in part or whole with voice recognition software. Voice recognition is usually quite accurate but there are transcription  errors that can and very often do occur. I apologize for any typographical errors that were not detected and corrected.    Cage Gupton, MD 10/16/2023, 11:26 AM

## 2023-10-18 ENCOUNTER — Ambulatory Visit (INDEPENDENT_AMBULATORY_CARE_PROVIDER_SITE_OTHER)

## 2023-10-18 ENCOUNTER — Ambulatory Visit: Payer: Self-pay | Admitting: Emergency Medicine

## 2023-10-18 ENCOUNTER — Ambulatory Visit
Admission: EM | Admit: 2023-10-18 | Discharge: 2023-10-18 | Disposition: A | Discharge status: Home / Self Care | Attending: Emergency Medicine | Admitting: Emergency Medicine

## 2023-10-18 ENCOUNTER — Encounter: Payer: Self-pay | Admitting: Emergency Medicine

## 2023-10-18 DIAGNOSIS — B9689 Other specified bacterial agents as the cause of diseases classified elsewhere: Secondary | ICD-10-CM | POA: Insufficient documentation

## 2023-10-18 DIAGNOSIS — B3731 Acute candidiasis of vulva and vagina: Secondary | ICD-10-CM | POA: Diagnosis present

## 2023-10-18 DIAGNOSIS — R10819 Abdominal tenderness, unspecified site: Secondary | ICD-10-CM | POA: Insufficient documentation

## 2023-10-18 DIAGNOSIS — R3915 Urgency of urination: Secondary | ICD-10-CM | POA: Insufficient documentation

## 2023-10-18 DIAGNOSIS — R350 Frequency of micturition: Secondary | ICD-10-CM | POA: Diagnosis present

## 2023-10-18 DIAGNOSIS — N760 Acute vaginitis: Secondary | ICD-10-CM | POA: Diagnosis present

## 2023-10-18 LAB — URINALYSIS, W/ REFLEX TO CULTURE (INFECTION SUSPECTED)
Bilirubin Urine: NEGATIVE
Glucose, UA: NEGATIVE mg/dL
Ketones, ur: NEGATIVE mg/dL
Leukocytes,Ua: NEGATIVE
Nitrite: NEGATIVE
Protein, ur: NEGATIVE mg/dL
Specific Gravity, Urine: 1.01 (ref 1.005–1.030)
pH: 6 (ref 5.0–8.0)

## 2023-10-18 LAB — PREGNANCY, URINE: Preg Test, Ur: NEGATIVE

## 2023-10-18 MED ORDER — PHENAZOPYRIDINE HCL 200 MG PO TABS: 200.0000 mg | ORAL_TABLET | Freq: Three times a day (TID) | ORAL | 0 refills | Status: AC | PRN

## 2023-10-18 MED ORDER — IBUPROFEN 600 MG PO TABS: 600.0000 mg | ORAL_TABLET | Freq: Three times a day (TID) | ORAL | 0 refills | Status: AC | PRN

## 2023-10-18 MED ORDER — METRONIDAZOLE 500 MG PO TABS: 500.0000 mg | ORAL_TABLET | Freq: Two times a day (BID) | ORAL | 0 refills | Status: AC

## 2023-10-18 MED ORDER — FLUCONAZOLE 150 MG PO TABS: 150.0000 mg | ORAL_TABLET | Freq: Once | ORAL | 1 refills | Status: DC

## 2023-10-18 NOTE — ED Provider Notes (Signed)
 HPI  SUBJECTIVE:  Jordan Riley is a 23 y.o. female who presents with 3 days of thick dark yellow odorous vaginal discharge, vaginal itching, crampy low midline nonmigratory, nonradiating abdominal pelvic pain that lasts minutes.  She reports urinary urgency, frequency, and possible hematuria, she has noted blood with wiping.  No dysuria, cloudy or odorous urine, vaginal bleeding, nausea, vomiting, fevers, abdominal distention.  She her last bowel movement was normal today with no change in her pain.  She reports mild right back pain that feels as if she worked out.  No antipyretic in the past 6 hours.  She tried Azo and cranberry juice, Tylenol 1000 mg.  The Tylenol helped with the cramping.  No aggravating factors.  Abdominal pain is not associated with walking, standing.  She states the car ride over here was not painful.  She is in a relatively new monogamous relationship with a female, who is asymptomatic.  She states that they both tested negative for STDs 1.5 months ago and neither of them have other partners.  She has a past medical history of UTI, vaginal yeast infections.  No history of pyelonephritis, nephrolithiasis, STDs, BV, PID, ectopic pregnancy, diabetes, TOA, PCOS, ovarian cyst, ovarian torsion.  LMP: 7/26 - 7/31.  PCP: [facility].   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   Depression     Past Surgical History:  Procedure Laterality Date   TONSILLECTOMY AND ADENOIDECTOMY      Family History  Problem Relation Age of Onset   Bipolar disorder Mother    Heart disease Mother    Hypothyroidism Mother    Mental illness Sister    Mental illness Maternal Grandfather    Mental illness Maternal Grandmother     Social History   Tobacco Use   Smoking status: Former    Types: E-cigarettes   Smokeless tobacco: Never  Vaping Use   Vaping status: Former   Substances: Nicotine  Substance Use Topics   Alcohol  use: Never   Drug use: Never    No current facility-administered medications for this encounter.  Current Outpatient Medications:    escitalopram  (LEXAPRO ) 10 MG tablet, Take 1 tablet (10 mg total) by mouth daily., Disp: 30 tablet, Rfl: 5   escitalopram  (LEXAPRO ) 5 MG tablet, Take 1 tablet (5 mg total) by mouth daily. Take daily with 10 mg , total of 15 mg daily, Disp: 30 tablet, Rfl: 5   fluconazole  (DIFLUCAN ) 150 MG tablet, Take 1 tablet (150 mg total) by mouth once for 1 dose. 1 tab po x 1. May repeat in 72 hours if no improvement, Disp: 2 tablet, Rfl: 1   ibuprofen  (ADVIL ) 600 MG tablet, Take 1 tablet (600 mg total) by mouth every 8 (eight) hours as needed., Disp: 30 tablet, Rfl: 0   metroNIDAZOLE  (FLAGYL ) 500 MG tablet, Take 1 tablet (500 mg total) by mouth 2 (two) times daily for 7 days., Disp: 14 tablet, Rfl: 0   phenazopyridine  (PYRIDIUM ) 200 MG tablet, Take 1 tablet (200 mg total) by mouth 3 (three) times daily as needed for pain., Disp: 6 tablet, Rfl: 0   propranolol  (INDERAL ) 10 MG tablet, TAKE 1 TABLET BY MOUTH 2 TIMES DAILY AS NEEDED., Disp: 60 tablet, Rfl: 1   lidocaine  (XYLOCAINE ) 2 % solution, Use as directed 15 mLs in the mouth or throat every 3 (three) hours as needed for mouth pain (swish and spit)., Disp:  100 mL, Rfl: 0   SYEDA 3-0.03 MG tablet, Take 1 tablet by mouth daily., Disp: , Rfl:   Allergies  Allergen Reactions   Shellfish Allergy Anaphylaxis   Cefdinir Nausea And Vomiting     ROS  As noted in HPI.   Physical Exam  BP 106/67 (BP Location: Right Arm)   Pulse 70   Temp 98.6 F (37 C) (Oral)   Resp 16   Ht 5' 6 (1.676 m)   Wt 70 kg   LMP 10/10/2023 (Exact Date)   SpO2 97%   BMI 24.91 kg/m   Constitutional: Well developed, well nourished, no acute distress.  Moving around comfortably Eyes:  EOMI, conjunctiva normal bilaterally HENT: Normocephalic, atraumatic,mucus membranes moist Respiratory: Normal inspiratory effort Cardiovascular: Normal  rate GI: Flat, nondistended soft, active bowel sounds.  No rebound, guarding.  No lower quadrant, suprapubic tenderness.  Positive mild right flank tenderness with deep palpation.  Negative tap table test.  Negative Murphy.  Negative McBurney. back: No CVA tenderness bilaterally.  Positive right paralumbar tenderness GU: Deferred per patient request skin: No rash, skin intact Musculoskeletal: no deformities Neurologic: Alert & oriented x 3, no focal neuro deficits Psychiatric: Speech and behavior appropriate   ED Course   Medications - No data to display  Orders Placed This Encounter  Procedures   Urine Culture    Standing Status:   Standing    Number of Occurrences:   1    Indication:   Suprapubic pain   DG Abd 1 View    Standing Status:   Standing    Number of Occurrences:   1    Reason for Exam (SYMPTOM  OR DIAGNOSIS REQUIRED):   Questionable hematuria, urgency, frequency, left flank pain, rule out nephrolithiasis   Urinalysis, w/ Reflex to Culture (Infection Suspected) -Urine, Clean Catch    Standing Status:   Standing    Number of Occurrences:   1    Specimen Source:   Urine, Clean Catch [76]   Pregnancy, urine    Standing Status:   Standing    Number of Occurrences:   1    Results for orders placed or performed during the hospital encounter of 10/18/23 (from the past 24 hours)  Urinalysis, w/ Reflex to Culture (Infection Suspected) -Urine, Clean Catch     Status: Abnormal   Collection Time: 10/18/23  2:47 PM  Result Value Ref Range   Specimen Source URINE, CLEAN CATCH    Color, Urine YELLOW YELLOW   APPearance CLEAR CLEAR   Specific Gravity, Urine 1.010 1.005 - 1.030   pH 6.0 5.0 - 8.0   Glucose, UA NEGATIVE NEGATIVE mg/dL   Hgb urine dipstick TRACE (A) NEGATIVE   Bilirubin Urine NEGATIVE NEGATIVE   Ketones, ur NEGATIVE NEGATIVE mg/dL   Protein, ur NEGATIVE NEGATIVE mg/dL   Nitrite NEGATIVE NEGATIVE   Leukocytes,Ua NEGATIVE NEGATIVE   Squamous Epithelial / HPF  0-5 0 - 5 /HPF   WBC, UA 0-5 0 - 5 WBC/hpf   RBC / HPF 0-5 0 - 5 RBC/hpf   Bacteria, UA FEW (A) NONE SEEN  Pregnancy, urine     Status: None   Collection Time: 10/18/23  2:55 PM  Result Value Ref Range   Preg Test, Ur NEGATIVE NEGATIVE   DG Abd 1 View Result Date: 10/18/2023 CLINICAL DATA:  Hematuria, urinary urgency and frequency, left flank pain EXAM: ABDOMEN - 1 VIEW COMPARISON:  None Available. FINDINGS: Two supine frontal views of the abdomen and pelvis  are obtained. No bowel obstruction or ileus. Bowel gas and stool obscure portions of the renal silhouettes. Costal cartilage calcifications project over the renal silhouettes, with no definitive signs of urinary tract calculi. If urinary tract calculi remain a clinical concern, CT could be considered. No masses or abnormal calcifications. No acute bony abnormalities. IMPRESSION: 1. No evidence of radiopaque urinary tract calculi on this exam. Prominent costal cartilage calcifications and bowel gas and stool project over the renal silhouettes, limiting evaluation. If further imaging is desired, CT of the abdomen and pelvis could be considered. Electronically Signed   By: Ozell Daring M.D.   On: 10/18/2023 15:35    ED Clinical Impression  1. Right flank tenderness   2. Urinary urgency   3. Urinary frequency   4. Bacterial vaginosis   5. Yeast vaginitis     ED Assessment/Plan    Patient appears nontoxic.  Abdomen benign.  She has no evidence of a surgical abdomen.  Doubt TOA, ovarian torsion, appendicitis, obstruction, perforation.    Urinalysis has trace hematuria and a few bacteria.  Negative nitrites, esterase.  Urine pregnancy negative.  I am concerned for nephrolithiasis, so we will get a KUB.  Will send urine off for culture to confirm absence of UTI and will withhold antibiotics for UTI pending culture results.  I suspect that she has BV and yeast with the odorous vaginal discharge and vaginal itching.  I suspect this is what is  causing her symptoms.  Offered to do pelvic, but patient declined.  I think this is reasonable as I doubt PID at this time.  She states that she will follow-up with her OB/GYN if her pelvic pain gets worse.  Sending off swab for gonorrhea, chlamydia, trichomonas, BV and yeast.  Will treat empirically for BV and yeast with Flagyl  and Diflucan  respectively.  Pyridium  for urinary symptoms.  Tylenol/ibuprofen  together 3-4 times a day as needed for pain.  ER return precautions given.  Reviewed radiology independently.  No radiopaque stones.  Will contact patient at [phone] if radiology overread differs nevermind and we need to change management.  Reviewed radiology report.  No radiopaque calculi consistent with my read.  See report for details  Discussed imaging, labs, MDM, plan and followup with patient. Pt agrees with plan.   Meds ordered this encounter  Medications   metroNIDAZOLE  (FLAGYL ) 500 MG tablet    Sig: Take 1 tablet (500 mg total) by mouth 2 (two) times daily for 7 days.    Dispense:  14 tablet    Refill:  0   fluconazole  (DIFLUCAN ) 150 MG tablet    Sig: Take 1 tablet (150 mg total) by mouth once for 1 dose. 1 tab po x 1. May repeat in 72 hours if no improvement    Dispense:  2 tablet    Refill:  1   phenazopyridine  (PYRIDIUM ) 200 MG tablet    Sig: Take 1 tablet (200 mg total) by mouth 3 (three) times daily as needed for pain.    Dispense:  6 tablet    Refill:  0   ibuprofen  (ADVIL ) 600 MG tablet    Sig: Take 1 tablet (600 mg total) by mouth every 8 (eight) hours as needed.    Dispense:  30 tablet    Refill:  0    *This clinic note was created using Scientist, clinical (histocompatibility and immunogenetics). Therefore, there may be occasional mistakes despite careful proofreading.  ?     Van Knee, MD 10/18/23 [phone]

## 2023-10-18 NOTE — ED Triage Notes (Signed)
 Pt c/o dark yellow vaginal discharge and pink discharge, lower abdominal cramping, and vaginal itching. Started about 3 days ago. She states she was tested for STD about a month ago and was negative and has not had a sexual partner since.

## 2023-10-18 NOTE — Discharge Instructions (Addendum)
 I did not appreciate any kidney stones on your x-ray.  However as we discussed, plain films do not pick up all kidney stones.  I am sending you home with Pyridium  for your urinary symptoms.  This will turn your urine orange.  Continue drinking plenty of fluids.  Flagyl  for possible BV, Diflucan  for possible yeast infection.  Take 600 mg of ibuprofen , 1000 mg of Tylenol 3-4 times a day as needed for pain.  Follow-up with your OB/GYN if your pelvic pain continues, go to the ER if you get worse.  Give us  a working phone number so that we can contact you if needed. Refrain from sexual contact until all of your labs have come back, symptoms have resolved, and your partner(s) are treated if necessary. Return to the ER if you get worse, have a fever >100.4, or for any concerns.   Go to www.goodrx.com  or www.costplusdrugs.com to look up your medications. This will give you a list of where you can find your prescriptions at the most affordable prices. Or ask the pharmacist what the cash price is, or if they have any other discount programs available to help make your medication more affordable. This can be less expensive than what you would pay with insurance.

## 2023-10-19 LAB — CERVICOVAGINAL ANCILLARY ONLY
Bacterial Vaginitis (gardnerella): POSITIVE — AB
Candida Glabrata: NEGATIVE
Candida Vaginitis: POSITIVE — AB
Chlamydia: NEGATIVE
Comment: NEGATIVE
Comment: NEGATIVE
Comment: NEGATIVE
Comment: NEGATIVE
Comment: NEGATIVE
Comment: NORMAL
Neisseria Gonorrhea: NEGATIVE
Trichomonas: NEGATIVE

## 2023-10-19 LAB — URINE CULTURE: Culture: NO GROWTH

## 2023-10-23 ENCOUNTER — Encounter: Payer: Self-pay | Admitting: Physician Assistant

## 2023-10-26 ENCOUNTER — Other Ambulatory Visit: Payer: Self-pay | Admitting: Physician Assistant

## 2023-10-26 MED ORDER — EPINEPHRINE 0.3 MG/0.3ML IJ SOAJ: 0.3000 mg | INTRAMUSCULAR | 2 refills | Status: AC | PRN

## 2023-12-11 ENCOUNTER — Telehealth: Admitting: Psychiatry

## 2023-12-17 ENCOUNTER — Ambulatory Visit: Admitting: Nurse Practitioner

## 2023-12-17 ENCOUNTER — Encounter: Payer: Self-pay | Admitting: Nurse Practitioner

## 2023-12-17 VITALS — BP 100/78 | HR 83 | Temp 98.2°F | Resp 16 | Ht 66.0 in | Wt 156.8 lb

## 2023-12-17 DIAGNOSIS — B9689 Other specified bacterial agents as the cause of diseases classified elsewhere: Secondary | ICD-10-CM | POA: Diagnosis not present

## 2023-12-17 DIAGNOSIS — R102 Pelvic and perineal pain unspecified side: Secondary | ICD-10-CM | POA: Diagnosis not present

## 2023-12-17 DIAGNOSIS — N760 Acute vaginitis: Secondary | ICD-10-CM

## 2023-12-17 DIAGNOSIS — N898 Other specified noninflammatory disorders of vagina: Secondary | ICD-10-CM | POA: Diagnosis not present

## 2023-12-17 MED ORDER — METRONIDAZOLE 500 MG PO TABS: 500.0000 mg | ORAL_TABLET | Freq: Two times a day (BID) | ORAL | 0 refills | Status: AC

## 2023-12-17 NOTE — Progress Notes (Signed)
 [facility] [address] [place], [place] [address]  Internal MEDICINE  Office Visit Note  Patient Name: Jordan Riley  [id number]  [id number]  Date of Service: 12/17/2023  Chief Complaint  Patient presents with   Acute Visit    Vaginal discharge and odor-- cramps     HPI Ester presents for an acute sick visit for vaginal discharge --onset of symptoms was about 4 days ago  --reports abdominal/pelvic cramping, fishy vaginal odor and an orange/yellow vaginal discharge.  Had BV and yeast infection in August but symptoms have returned.      Current Medication:  Outpatient Encounter Medications as of 12/17/2023  Medication Sig   EPINEPHrine  0.3 mg/0.3 mL IJ SOAJ injection Inject 0.3 mg into the muscle as needed for anaphylaxis.   metroNIDAZOLE  (FLAGYL ) 500 MG tablet Take 1 tablet (500 mg total) by mouth 2 (two) times daily for 7 days. Take with food.   escitalopram  (LEXAPRO ) 10 MG tablet Take 1 tablet (10 mg total) by mouth daily.   escitalopram  (LEXAPRO ) 5 MG tablet Take 1 tablet (5 mg total) by mouth daily. Take daily with 10 mg , total of 15 mg daily   ibuprofen  (ADVIL ) 600 MG tablet Take 1 tablet (600 mg total) by mouth every 8 (eight) hours as needed.   lidocaine  (XYLOCAINE ) 2 % solution Use as directed 15 mLs in the mouth or throat every 3 (three) hours as needed for mouth pain (swish and spit).   phenazopyridine  (PYRIDIUM ) 200 MG tablet Take 1 tablet (200 mg total) by mouth 3 (three) times daily as needed for pain.   propranolol  (INDERAL ) 10 MG tablet TAKE 1 TABLET BY MOUTH 2 TIMES DAILY AS NEEDED.   SYEDA 3-0.03 MG tablet Take 1 tablet by mouth daily.   No facility-administered encounter medications on file as of 12/17/2023.      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  Negative for fatigue.  Respiratory: Negative.  Negative for cough, chest tightness, shortness of breath and wheezing.   Cardiovascular: Negative.  Negative for chest pain and palpitations.  Gastrointestinal: Negative.   Genitourinary:  Positive for dysuria, pelvic pain and vaginal discharge.  Musculoskeletal: Negative.   Neurological: Negative.     Physical Exam Vitals reviewed.  Constitutional:      General: She is not in acute distress.    Appearance: Normal appearance. She is not ill-appearing.  HENT:     Head: Normocephalic and atraumatic.  Eyes:     Pupils: Pupils are equal, round, and reactive to light.  Cardiovascular:     Rate and Rhythm: Normal rate and regular rhythm.  Pulmonary:     Effort: Pulmonary effort is normal. No respiratory distress.  Neurological:     Mental Status: She is alert and oriented to person, place, and time.  Psychiatric:        Mood and Affect: Mood normal.        Behavior: Behavior normal.       Assessment/Plan: 1. BV (bacterial vaginosis) (Primary) Metronidazole  prescribed, take until  gone. Vaginal swab sent to the lab  - NuSwab Vaginitis Plus (VG+) - metroNIDAZOLE  (FLAGYL ) 500 MG tablet; Take 1 tablet (500 mg total) by mouth 2 (two) times daily for 7 days. Take with food.  Dispense: 14 tablet; Refill: 0  2. Vaginal discharge Metronidazole  prescribed, take until gone. Vaginal swab sent to the lab  - NuSwab Vaginitis Plus (VG+) - metroNIDAZOLE  (FLAGYL ) 500 MG tablet; Take 1 tablet (500 mg total) by mouth 2 (two) times daily for 7 days. Take with food.  Dispense: 14 tablet; Refill: 0  3. Pelvic cramping Metronidazole  prescribed, take until gone  - metroNIDAZOLE  (FLAGYL ) 500 MG tablet; Take 1 tablet (500 mg total) by mouth 2 (two) times daily for 7 days. Take with food.  Dispense: 14 tablet; Refill: 0   General  Counseling: Waddell verbalizes understanding of the findings of todays visit and agrees with plan of treatment. I have discussed any further diagnostic evaluation that may be needed or ordered today. We also reviewed her medications today. she has been encouraged to call the office with any questions or concerns that should arise related to todays visit.    Counseling:    Orders Placed This Encounter  Procedures   NuSwab Vaginitis Plus (VG+)    Meds ordered this encounter  Medications   metroNIDAZOLE  (FLAGYL ) 500 MG tablet    Sig: Take 1 tablet (500 mg total) by mouth 2 (two) times daily for 7 days. Take with food.    Dispense:  14 tablet    Refill:  0    Fill new script today    Return if symptoms worsen or fail to improve.  [place] Controlled Substance Database was reviewed by me for overdose risk score (ORS)  Time spent:30 Minutes Time spent with patient included reviewing progress notes, labs, imaging studies, and discussing plan for follow up.   This patient was seen by Mardy Maxin, FNP-C in collaboration with Dr. Sigrid Bathe as a part of collaborative care agreement.  Jahmiyah Dullea R. Maxin, MSN, FNP-C Internal Medicine

## 2023-12-20 ENCOUNTER — Other Ambulatory Visit: Payer: Self-pay | Admitting: Nurse Practitioner

## 2023-12-20 ENCOUNTER — Encounter: Payer: Self-pay | Admitting: Nurse Practitioner

## 2023-12-20 LAB — NUSWAB VAGINITIS PLUS (VG+): Candida albicans, NAA: POSITIVE — AB

## 2023-12-20 MED ORDER — FLUCONAZOLE 150 MG PO TABS: 150.0000 mg | ORAL_TABLET | Freq: Every day | ORAL | 0 refills | Status: AC

## 2024-01-16 ENCOUNTER — Encounter: Payer: Self-pay | Admitting: Nurse Practitioner

## 2024-01-20 ENCOUNTER — Encounter: Payer: Self-pay | Admitting: Psychiatry

## 2024-01-20 ENCOUNTER — Telehealth: Admitting: Psychiatry

## 2024-01-20 DIAGNOSIS — F321 Major depressive disorder, single episode, moderate: Secondary | ICD-10-CM | POA: Insufficient documentation

## 2024-01-20 DIAGNOSIS — Z79899 Other long term (current) drug therapy: Secondary | ICD-10-CM

## 2024-01-20 DIAGNOSIS — F411 Generalized anxiety disorder: Secondary | ICD-10-CM | POA: Diagnosis not present

## 2024-01-20 DIAGNOSIS — Z5181 Encounter for therapeutic drug level monitoring: Secondary | ICD-10-CM

## 2024-01-20 MED ORDER — ESCITALOPRAM OXALATE 5 MG PO TABS: 5.0000 mg | ORAL_TABLET | Freq: Every day | ORAL | 5 refills | Status: AC

## 2024-01-20 MED ORDER — MIRTAZAPINE 7.5 MG PO TABS
7.5000 mg | ORAL_TABLET | Freq: Every day | ORAL | 1 refills | Status: AC
Start: 2024-01-20 — End: ?

## 2024-01-20 MED ORDER — ESCITALOPRAM OXALATE 10 MG PO TABS: 10.0000 mg | ORAL_TABLET | Freq: Every day | ORAL | 5 refills | Status: AC

## 2024-01-20 NOTE — Patient Instructions (Signed)
 Clonazepam Tablets What is this medication? CLONAZEPAM (kloe NA ze pam) treats seizures. It is also used to treat panic disorder. It works by education administrator system calm down. It belongs to a group of medications called benzodiazepines. This medicine may be used for other purposes; ask your health care provider or pharmacist if you have questions. COMMON BRAND NAME(S): Ceberclon, Klonopin What should I tell my care team before I take this medication? They need to know if you have any of these conditions: Glaucoma Kidney disease Liver disease Lung or breathing disease, such as asthma, COPD, sleep apnea Mental health conditions Porphyria Substance use disorder Suicidal thoughts, plans, or attempt by you or a family member Trouble swallowing An unusual or allergic reaction to clonazepam, other medications, foods, dyes, or preservatives Pregnant or trying to get pregnant Breastfeeding How should I use this medication? Take this medication by mouth with water. Take it as directed on the prescription label at the same time every day. You can take it with or without food. If it upsets your stomach, take it with food. Do not take it more often than directed. Keep taking it unless your care team tells you to stop. A special MedGuide will be given to you by the pharmacist with each prescription and refill. Be sure to read this information carefully each time. Talk to your care team about the use of this medication in children. Special care may be needed. People 65 years and older may have a stronger reaction and need a smaller dose. Overdosage: If you think you have taken too much of this medicine contact a poison control center or emergency room at once. NOTE: This medicine is only for you. Do not share this medicine with others. What if I miss a dose? If you miss a dose, take it as soon as you can. If it is almost time for your next dose, take only that dose. Do not take double or extra  doses. What may interact with this medication? Do not take this medication with any of the following: Sodium oxybate This medication may also interact with the following: Alcohol Medications that cause drowsiness before a procedure, such as propofol Medications that help you fall asleep Medications that relax muscles Opioids for pain or cough Other benzodiazepines Phenothiazines, such as chlorpromazine, prochlorperazine, thioridazine Some antihistamines Some medications for depression, such as amitriptyline or trazodone  Some medications for seizures, such as phenobarbital or primidone Supplements, such as green tea, melatonin, St. John's wort, valerian Other medications may affect the way this medication works. Talk with your care team about all the medications you take. They may suggest changes to your treatment plan to lower the risk of side effects and to make sure your medications work as intended. This list may not describe all possible interactions. Give your health care provider a list of all the medicines, herbs, non-prescription drugs, or dietary supplements you use. Also tell them if you smoke, drink alcohol, or use illegal drugs. Some items may interact with your medicine. What should I watch for while using this medication? Visit your care team for regular checks on your progress. Tell your care team if your symptoms do not start to get better or if they get worse. There is a risk of abuse, misuse, and addiction with this medication. It is important to take this medication as directed by your care team. This medication may affect your coordination, reaction time, or judgment. Do not drive or operate machinery until you know how  this medication affects you. Sit up or stand slowly to reduce the risk of dizzy or fainting spells. Drinking alcohol with this medication can increase the risk of these side effects. This medication is a CNS depressant. This is a type of medication or  substance that slows down your brain and nervous system. Taking it with other CNS depressants can make you too sleepy. This can make it hard to breathe and stay awake. In some cases, it can cause coma and death. CNS depressants include opioids, benzodiazepines, muscle relaxants, medications for sleep, alcohol, and street drugs. Talk to your care team about all the medications, vitamins, and supplements you take. They can tell you what is safe to take together. Call emergency services right away if you have slow or shallow breathing, feel dizzy or confused, or have trouble staying awake. Do not stop taking this medication or reduce your dose without first talking to your care team. If you have taken this medication for a long time or take a high dose, your body may rely on it. Stopping it suddenly may cause a severe reaction. Talk to your care team about how long you need to take this medication. When it is time to stop, the dose will be slowly lowered over time to reduce the risk of side effects. This medication may worsen depression and cause thoughts of suicide. This can happen at any time but is more common after first starting treatment and after a change in dose. Talk to your care team right away if you have changes in mood and behavior or thoughts of self-harm or suicide. They can help you. Talk to your care team if you may be pregnant. Serious fetal side effects can occur if you take this medication during pregnancy. Prolonged use of this medication during pregnancy can cause temporary withdrawal in a newborn. Talk to your care team before breastfeeding. Changes to your treatment plan may be needed. If you breastfeed while taking this medication, seek medical care right away if you notice the child has slow or noisy breathing, is unusually sleepy or not able to wake up, or is limp. What side effects may I notice from receiving this medication? Side effects that you should report to your care team as  soon as possible: Allergic reactions--skin rash, itching, hives, swelling of the face, lips, tongue, or throat CNS depression--slow or shallow breathing, shortness of breath, feeling faint, dizziness, confusion, trouble staying awake Thoughts of suicide or self-harm, worsening mood, feelings of depression Side effects that usually do not require medical attention (report these to your care team if they continue or are bothersome): Dizziness Drowsiness Headache This list may not describe all possible side effects. Call your doctor for medical advice about side effects. You may report side effects to FDA at 1-800-FDA-1088. Where should I keep my medication? Keep out of the reach of children and pets. Store it out of sight in a safe place. Do not share it with others. Misuse of this medication is dangerous and against the law. Store at room temperature between 20 and 25 degrees C (68 and 77 degrees F). Get rid of any unused medication after the expiration date. This medication may cause harm and death if it is taken by other adults, children, or pets. It is important to get rid of the medication as soon as you no longer need it, or it is expired. To get rid of this medication: Take the medication to a take-back program. Check with your pharmacy or  law enforcement to find a location. Follow the steps given to you by your pharmacy. You may be given a pre-paid mail-back envelope or disposal product to safely get rid of your medication. If other options are not available, check the package insert or medication guide to see if it should be flushed down the toilet or put in your trash at home. If you are not sure, ask your care team. If it is safe to put it in your trash, empty the medication out of the container. Mix it with cat litter, dirt, used coffee grounds, or another unwanted substance. Seal the mixture in a container, such as a plastic bag. Put it in the trash. NOTE: This sheet is a summary. It may  not cover all possible information. If you have questions about this medicine, talk to your doctor, pharmacist, or health care provider.  2025 [employer]/Gold Standard (2023-06-25 00:00:00)Mirtazapine Tablets What is this medication? MIRTAZAPINE (mir TAZ a peen) treats depression. It increases the amount of serotonin and norepinephrine in the brain, substances that help regulate mood. This medicine may be used for other purposes; ask your health care provider or pharmacist if you have questions. COMMON BRAND NAME(S): Remeron What should I tell my care team before I take this medication? They need to know if you have any of these conditions: Bipolar disorder Dehydration Glaucoma Have had a stroke Heart or blood vessel conditions Kidney disease Liver disease Low blood pressure Low levels of sodium in the blood Low white blood cell levels Seizures Suicidal thoughts, plans, or attempt An unusual or allergic reaction to mirtazapine, other medications, foods, dyes, or preservatives Pregnant or trying to get pregnant Breastfeeding How should I use this medication? Take this medication by mouth with water. Take it as directed on the prescription label at the same time every day. You can take it with or without food. If it upsets your stomach, take it with food. Keep taking this medication unless your care team tells you to stop. Stopping it too quickly can cause serious side effects. It can also make your condition worse. A special MedGuide will be given to you by the pharmacist with each prescription and refill. Be sure to read this information carefully each time. Talk to your care team about the use of this medication in children. Special care may be needed. Overdosage: If you think you have taken too much of this medicine contact a poison control center or emergency room at once. NOTE: This medicine is only for you. Do not share this medicine with others. What if I miss a dose? If you miss a  dose, take it as soon as you can. If it is almost time for your next dose, take only that dose. Do not take double or extra doses. What may interact with this medication? Do not take this medication with any of the following: Cisapride Dronedarone Levoketoconazole Linezolid MAOIs, such as Marplan, Nardil, and Parnate Methylene blue Pimozide Some medications for fungal infections, such as ketoconazole, posaconazole, voriconazole Thioridazine This medication may also interact with the following: Alcohol Benzodiazepines, such as lorazepam Cimetidine Clarithromycin Other medications that cause heart rhythm changes Opioids Rifampin Some medications for depression, anxiety, or other mental health conditions Some medication for migraines, such as sumatriptan Some medications for seizures, such as carbamazepine or phenytoin Stimulant medications for ADHD, weight loss, or staying awake Supplements, such as St. John's wort or tryptophan Warfarin Other medications may affect the way this medication works. Talk with your care team about  all the medications you take. They may suggest changes to your treatment plan to lower the risk of side effects and to make sure your medications work as intended. This list may not describe all possible interactions. Give your health care provider a list of all the medicines, herbs, non-prescription drugs, or dietary supplements you use. Also tell them if you smoke, drink alcohol, or use illegal drugs. Some items may interact with your medicine. What should I watch for while using this medication? Visit your care team for regular checks on your progress. It may be some time before you see the benefit from this medication. This medication may worsen depression and cause thoughts of suicide. This can happen at any time but is more common after first starting treatment and after a change in dose. Talk to your care team right away if you have changes in mood and  behavior or thoughts of self-harm or suicide. They can help you. This medication may affect your coordination, reaction time, or judgment. Do not drive or operate machinery until you know how this medication affects you. Sit up or stand slowly to reduce the risk of dizzy or fainting spells. Drinking alcohol with this medication can increase the risk of these side effects. Your mouth may get dry. Chewing sugarless gum or sucking hard candy and drinking plenty of water may help. Contact your care team if the problem does not go away or is severe. What side effects may I notice from receiving this medication? Side effects that you should report to your care team as soon as possible: Allergic reactions--skin rash, itching, hives, swelling of the face, lips, tongue, or throat Heart rhythm changes--fast or irregular heartbeat, dizziness, feeling faint or lightheaded, chest pain, trouble breathing Infection--fever, chills, cough, or sore throat Irritability, confusion, fast or irregular heartbeat, muscle stiffness, twitching muscles, sweating, high fever, seizure, chills, vomiting, diarrhea, which may be signs of serotonin syndrome Low sodium level--muscle weakness, fatigue, dizziness, headache, confusion Rash, fever, and swollen lymph nodes Redness, blistering, peeling, or loosening of the skin, including inside the mouth Seizures Sudden eye pain or change in vision such as blurry vision, seeing halos around lights, vision loss Thoughts of suicide or self-harm, worsening mood, feelings of depression Side effects that usually do not require medical attention (report these to your care team if they continue or are bothersome): Constipation Dizziness Drowsiness Dry mouth Increase in appetite Weight gain This list may not describe all possible side effects. Call your doctor for medical advice about side effects. You may report side effects to FDA at 1-800-FDA-1088. Where should I keep my  medication? Keep out of the reach of children and pets. Store at room temperature between 20 and 25 degrees C (68 and 77 degrees F). Protect from light and moisture. Keep the container tightly closed. Get rid of any unused medication after the expiration date. To get rid of medications that are no longer needed or have expired: Take the medication to a take-back program. Check with your pharmacy or law enforcement to find a location. If you cannot return the medication, check the label or package insert to see if the medication should be thrown out in the garbage or flushed down the toilet. If you are not sure, ask your care team. If it is safe to put it in the trash, empty the medication out of the container. Mix it with cat litter, dirt, coffee grounds, or another unwanted substance. Seal the mixture in a bag or container. Put it  in the trash. NOTE: This sheet is a summary. It may not cover all possible information. If you have questions about this medicine, talk to your doctor, pharmacist, or health care provider.  2025 [employer]/Gold Standard (2023-07-01 00:00:00)

## 2024-01-20 NOTE — Progress Notes (Signed)
 Virtual Visit via Video Note  I connected with Jordan Riley on 01/20/24 at  9:00 AM EST by a video enabled telemedicine application and verified that I am speaking with the correct person using two identifiers.  Location Provider Location : [facility] Patient Location : Home  Participants: Patient , Provider    I discussed the limitations of evaluation and management by telemedicine and the availability of in person appointments. The patient expressed understanding and agreed to proceed.   I discussed the assessment and treatment plan with the patient. The patient was provided an opportunity to ask questions and all were answered. The patient agreed with the plan and demonstrated an understanding of the instructions.   The patient was advised to call back or seek an in-person evaluation if the symptoms worsen or if the condition fails to improve as anticipated.   BH MD OP Progress Note  01/20/2024 9:36 AM Jordan Riley  MRN:  [id number]  Chief Complaint:  Chief Complaint  Patient presents with   Anxiety   Depression   Medication Refill   Follow-up   Discussed the use of AI scribe software for clinical note transcription with the patient, who gave verbal consent to proceed.  History of Present Illness Jordan Riley is a 23 year old Caucasian female employed, single, lives in [place], has a history of GAD, episodic mood disorder, insomnia was 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  She reports she has experienced an increase in dark, obsessive thoughts over the past couple of months, particularly during periods of heightened anxiety. She describes these thoughts as worst-case scenario images, and she notes that they make her feel very sad and are difficult to quiet, even when she takes propranolol . She clarifies that these thoughts are not violent but have become more frequent and intense than previously.  Ongoing anxiety related to situational stressors, including her final semester of  college, job uncertainty due to potential changes in federal funding at her workplace, and challenges in her relationship, continues to affect her. She identifies anxious attachment as a factor impacting her relationship. She experiences panic attacks that prompt her to take propranolol , but her anxiety and obsessive thoughts persist while she uses medication.  Over the past 2 weeks, she endorses multiple depressive symptoms, including lack of interest or pleasure in activities almost every day, feeling down or hopeless daily, sleep disturbances with frequent early morning awakenings and difficulty returning to sleep, low energy few times a week, stress eating or poor appetite , and daily difficulty concentrating. 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  Her current medications include Lexapro  15 mg daily and propranolol  10 mg as needed for panic attacks. She reports that Lexapro  has helped her overall ability to stay calm, but situational anxiety and obsessive thoughts remain problematic. She has recently restarted therapy and attended 1 session with her therapist, Ms.Ellouise, to work on coping mechanisms.  She denies suicidal thoughts.  Denies any homicidality or perceptual disturbances.  She denies current use of alcohol, cannabis or any other illicit drugs.  She is currently finishing her last semester of college, pursuing a bachelor's in business administration with a concentration in business studies. She works full time in a chartered certified accountant. She lives alone at home. She previously lived with her boyfriend due to a mold issue but has returned home. She enjoys scrapbooking as a hobby.    Visit Diagnosis:    ICD-10-CM   1. GAD (generalized anxiety disorder)  F41.1 TSH    Vitamin B12    Urine drugs of abuse scrn w alc, routine (Ref Lab)    escitalopram  (LEXAPRO ) 10 MG tablet    escitalopram  (LEXAPRO ) 5 MG  tablet    mirtazapine (REMERON) 7.5 MG tablet    2. Current moderate episode of major depressive disorder without prior episode (HCC)  F32.1 TSH    Vitamin B12    Urine drugs of abuse scrn w alc, routine (Ref Lab)    mirtazapine (REMERON) 7.5 MG tablet    3. High risk medication use  Z79.899 TSH    Vitamin B12    Urine drugs of abuse scrn w alc, routine (Ref Lab)      Past Psychiatric History: I have reviewed past psychiatric history from progress note on 12/18/2018.  Past trials of Zoloft , hydroxyzine .  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  Family History:  Family History  Problem Relation Age of Onset   Bipolar disorder Mother    Heart disease Mother    Hypothyroidism Mother    Mental illness Sister    Mental illness Maternal Grandfather    Mental illness Maternal Grandmother     Social History: I have reviewed social history from progress note on 12/18/2018. Social History   Socioeconomic History   Marital status: Single    Spouse name: Not on file   Number of children: Not on file   Years of education: Not on file   Highest education level: Not on file  Occupational History   Not on file  Tobacco Use   Smoking status: Former    Types: E-cigarettes   Smokeless tobacco: Never  Vaping Use   Vaping status: Former   Substances: Nicotine  Substance and Sexual Activity   Alcohol use: Never   Drug use: Never   Sexual activity: Not Currently  Other Topics Concern   Not on file  Social History Narrative   Currently doing associates and employed full time   Social Drivers of Corporate Investment Banker Strain: Not on file  Food Insecurity: Not on file   Transportation Needs: Not on file  Physical Activity: Not on file  Stress: Not on file  Social Connections: Not on file    Allergies:  Allergies  Allergen Reactions   Shellfish Allergy Anaphylaxis   Cefdinir Nausea And Vomiting    Metabolic Disorder Labs: No results found for: HGBA1C, MPG No results found for: PROLACTIN Lab Results  Component Value Date   CHOL 188 (H) 09/22/2019   TRIG 107 (H) 09/22/2019   HDL 51 09/22/2019   LDLCALC 118 (H) 09/22/2019   Lab Results  Component Value Date   TSH 1.850 01/29/2021   TSH 3.880 09/22/2019    Therapeutic Level Labs: No results found for: LITHIUM No results found for: VALPROATE No results found for: CBMZ  Current Medications: Current Outpatient Medications  Medication Sig Dispense Refill   EPINEPHrine  0.3 mg/0.3 mL IJ SOAJ injection Inject 0.3 mg into the muscle as needed for anaphylaxis. 1 each 2   mirtazapine (REMERON) 7.5 MG tablet Take 1 tablet (7.5 mg total) by mouth at bedtime. 30 tablet 1  escitalopram  (LEXAPRO ) 10 MG tablet Take 1 tablet (10 mg total) by mouth daily. Take along with 5 mg daily 30 tablet 5   escitalopram  (LEXAPRO ) 5 MG tablet Take 1 tablet (5 mg total) by mouth daily. Take daily with 10 mg , total of 15 mg daily 30 tablet 5   ibuprofen  (ADVIL ) 600 MG tablet Take 1 tablet (600 mg total) by mouth every 8 (eight) hours as needed. 30 tablet 0   lidocaine  (XYLOCAINE ) 2 % solution Use as directed 15 mLs in the mouth or throat every 3 (three) hours as needed for mouth pain (swish and spit). 100 mL 0   phenazopyridine  (PYRIDIUM ) 200 MG tablet Take 1 tablet (200 mg total) by mouth 3 (three) times daily as needed for pain. 6 tablet 0   propranolol  (INDERAL ) 10 MG tablet TAKE 1 TABLET BY MOUTH 2 TIMES DAILY AS NEEDED. 60 tablet 1   SYEDA 3-0.03 MG tablet Take 1 tablet by mouth daily.     No current facility-administered medications for this visit.     Musculoskeletal: Strength & Muscle Tone:  UTA Gait & Station: Seated Patient leans: N/A  Psychiatric Specialty Exam: Review of Systems  Psychiatric/Behavioral:  Positive for decreased concentration, dysphoric mood and sleep disturbance. The patient is nervous/anxious.     There were no vitals taken for this visit.There is no height or weight on file to calculate BMI.  General Appearance: Casual  Eye Contact:  Fair  Speech:  Normal Rate  Volume:  Normal  Mood:  Anxious and Depressed  Affect:  Congruent  Thought Process:  Goal Directed and Descriptions of Associations: Intact  Orientation:  Full (Time, Place, and Person)  Thought Content: Rumination   Suicidal Thoughts:  No  Homicidal Thoughts:  No  Memory:  Immediate;   Fair Recent;   Fair Remote;   Fair  Judgement:  Fair  Insight:  Fair  Psychomotor Activity:  Normal  Concentration:  Concentration: Fair and Attention Span: Fair  Recall:  [employer] of Knowledge: Fair  Language: Fair  Akathisia:  No  Handed:  Right  AIMS (if indicated): not done  Assets:  Communication Skills Desire for Improvement Housing Resilience Social Support Talents/Skills Transportation  ADL's:  Intact  Cognition: WNL  Sleep:  Poor   Screenings: AIMS    Flowsheet Row Video Visit from [facility]/29/2021 in [facility]  AIMS Total Score 0   GAD-7    Flowsheet Row Video Visit from 01/20/2024 in [facility] Office Visit from 02/09/2023 in [facility] Video Visit from 07/29/2021 in [facility] Office Visit from 04/24/2021 in [facility] Video Visit from 12/17/2020 in [facility]  Total GAD-7 Score 18 14 9 2 18    PHQ2-9    Flowsheet Row Video Visit from 01/20/2024 in [facility] Office Visit from 02/09/2023 in [facility] Video Visit from 08/29/2021 in [facility] Video Visit from 07/29/2021 in [facility] Office Visit from 04/24/2021 in [facility]  PHQ-2 Total Score 6 2 1 2 1   PHQ-9 Total Score 17 9 6 6  --   Flowsheet Row Video Visit from 01/20/2024 in [facility] UC from 10/18/2023 in [facility]  Video Visit from 10/16/2023  in [facility]  C-SSRS RISK CATEGORY No Risk No Risk No Risk     Assessment and Plan: Jordan Riley is a 23 year old female, lives in [place], has a history of GAD, depression was 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  Discussed assessment and plan as noted below.  1. GAD (generalized anxiety disorder)-unstable Currently with worsening anxiety symptoms multiple situational stressors. Continue Lexapro  15 mg daily Start Mirtazapine 7.5 mg at bedtime. Will consider adding Clonazepam low dosage, limited supply on an as needed basis for significant anxiety attacks.Provided medication education including long-term risk of being on benzodiazepine therapy.  Will get urine drug screen prior to prescribing this medication. Patient aware of pregnancy implications of being on medications as noted above. Reviewed [place] PMP AWARxE   2. Current moderate episode of major depressive disorder without prior episode (HCC)-unstable Currently with depression symptoms including sleep problems, anhedonia, sadness appetite changes. Start Mirtazapine 7.5 mg at bedtime Continue Lexapro  as prescribed Encouraged to have more frequent sessions with therapist Ms. Ellouise Hummer, CBT.  3. High risk medication use Will order TSH, vitamin B12, urine drug screen.  Patient to go to [facility] lab.  Follow-up Follow-up in clinic in 2 weeks or sooner if  needed.    Collaboration of Care: Collaboration of Care: Referral or follow-up with counselor/therapist AEB encouraged to continue psychotherapy sessions with Ms. Ellouise Hummer.  Patient/Guardian was advised Release of Information must be obtained prior to any record release in order to collaborate their care with an outside provider. Patient/Guardian was advised if they have not already done so to contact the registration department to sign all necessary forms in order for us  to release information regarding their care.   Consent: Patient/Guardian gives verbal consent for treatment and assignment of benefits for services provided during this visit. Patient/Guardian expressed understanding and agreed to proceed.  This note was generated in part or whole with voice recognition software. Voice recognition is usually quite accurate but there are transcription errors that can and very often do occur. I apologize for any typographical errors that were not detected and corrected.     Felice Deem, MD 01/20/2024, 9:36 AM

## 2024-02-10 ENCOUNTER — Telehealth: Admitting: Psychiatry
